# Patient Record
Sex: Female | Born: 1975 | Race: White | Hispanic: Yes | Marital: Single | Smoking: Never smoker
Health system: Southern US, Community
[De-identification: ages and names within clinical notes are randomized; demographics above are authoritative.]

## PROBLEM LIST (undated history)

## (undated) DIAGNOSIS — K08109 Complete loss of teeth, unspecified cause, unspecified class: Secondary | ICD-10-CM

## (undated) DIAGNOSIS — Z5189 Encounter for other specified aftercare: Secondary | ICD-10-CM

## (undated) DIAGNOSIS — N920 Excessive and frequent menstruation with regular cycle: Secondary | ICD-10-CM

## (undated) DIAGNOSIS — D259 Leiomyoma of uterus, unspecified: Secondary | ICD-10-CM

## (undated) DIAGNOSIS — Z972 Presence of dental prosthetic device (complete) (partial): Secondary | ICD-10-CM

## (undated) DIAGNOSIS — D509 Iron deficiency anemia, unspecified: Secondary | ICD-10-CM

## (undated) DIAGNOSIS — C801 Malignant (primary) neoplasm, unspecified: Secondary | ICD-10-CM

## (undated) DIAGNOSIS — D219 Benign neoplasm of connective and other soft tissue, unspecified: Secondary | ICD-10-CM

## (undated) HISTORY — PX: BREAST EXCISIONAL BIOPSY: SUR124

## (undated) HISTORY — DX: Encounter for other specified aftercare: Z51.89

## (undated) HISTORY — PX: BREAST BIOPSY: SHX20

## (undated) HISTORY — DX: Iron deficiency anemia, unspecified: D50.9

## (undated) HISTORY — PX: BREAST SURGERY: SHX581

---

## 2010-05-03 ENCOUNTER — Encounter: Admission: RE | Admit: 2010-05-03 | Discharge: 2010-05-03 | Payer: Self-pay | Admitting: Family Medicine

## 2016-10-16 HISTORY — PX: BREAST EXCISIONAL BIOPSY: SUR124

## 2017-01-22 ENCOUNTER — Other Ambulatory Visit (HOSPITAL_COMMUNITY): Payer: Self-pay | Admitting: *Deleted

## 2017-01-22 DIAGNOSIS — N6310 Unspecified lump in the right breast, unspecified quadrant: Secondary | ICD-10-CM

## 2017-01-22 DIAGNOSIS — L539 Erythematous condition, unspecified: Secondary | ICD-10-CM

## 2017-02-08 ENCOUNTER — Other Ambulatory Visit (HOSPITAL_COMMUNITY): Payer: Self-pay | Admitting: Obstetrics and Gynecology

## 2017-02-08 ENCOUNTER — Ambulatory Visit (HOSPITAL_COMMUNITY)
Admission: RE | Admit: 2017-02-08 | Discharge: 2017-02-08 | Disposition: A | Discharge status: Home / Self Care | Payer: Self-pay | Source: Ambulatory Visit | Attending: Obstetrics and Gynecology | Admitting: Obstetrics and Gynecology

## 2017-02-08 ENCOUNTER — Ambulatory Visit
Admission: RE | Admit: 2017-02-08 | Discharge: 2017-02-08 | Disposition: A | Discharge status: Home / Self Care | Payer: No Typology Code available for payment source | Source: Ambulatory Visit | Attending: Obstetrics and Gynecology | Admitting: Obstetrics and Gynecology

## 2017-02-08 ENCOUNTER — Encounter (HOSPITAL_COMMUNITY): Payer: Self-pay | Admitting: *Deleted

## 2017-02-08 VITALS — BP 132/76 | Temp 98.7°F | Ht 61.0 in | Wt 132.0 lb

## 2017-02-08 DIAGNOSIS — N6310 Unspecified lump in the right breast, unspecified quadrant: Secondary | ICD-10-CM

## 2017-02-08 DIAGNOSIS — N644 Mastodynia: Secondary | ICD-10-CM

## 2017-02-08 DIAGNOSIS — Z1239 Encounter for other screening for malignant neoplasm of breast: Secondary | ICD-10-CM

## 2017-02-08 DIAGNOSIS — N6315 Unspecified lump in the right breast, overlapping quadrants: Secondary | ICD-10-CM

## 2017-02-08 DIAGNOSIS — N6489 Other specified disorders of breast: Secondary | ICD-10-CM

## 2017-02-08 DIAGNOSIS — L539 Erythematous condition, unspecified: Secondary | ICD-10-CM

## 2017-02-08 NOTE — Progress Notes (Signed)
Complaints of right breast lump x 7-8 years and a rash on bilateral breasts that was given some cream from the [facility].  Pap Smear: Pap smear not completed today. Last Pap smear was in March 2018 at the [facility] and normal per patient. Per patient has no history of an abnormal Pap smear.  Physical exam: Breasts Breasts symmetrical. Darkened area left breast at 7 o'clock right under areola. Darkened area right breast under areola. Patient stated rash was located within the darkened areas but healed since started using cream given by the [facility]. No nipple retraction bilateral breasts. No nipple discharge bilateral breasts. No lymphadenopathy. No lumps palpated left breast. Palpated a lump within the right breast at 9 o'clock 4 cm from the nipple. Complaints of left up outer breast tenderness on exam. Referred patient to the [facility] for diagnostic mammogram and possible bilateral breast ultrasounds. Appointment scheduled for Thursday, April. 26, 2018 at 1520.        Pelvic/Bimanual No Pap smear completed today since last Pap smear was in March 2018 per patient. Pap smear not indicated per BCCCP guidelines.   Smoking History: Patient has never smoked.  Patient Navigation: Patient education provided. Access to services provided for patient through [facility] program. Spanish interpreter provided.  Used Spanish interpreter Marta Col from [facility].

## 2017-02-08 NOTE — Patient Instructions (Signed)
Explained breast self awareness with Evangelin Payes-Martinez. Patient did not need a Pap smear today due to last Pap smear was in March 2018 per patient. Let her know BCCCP will cover Pap smears every 3 years unless has a history of abnormal Pap smears. Referred patient to the [facility] for diagnostic mammogram and possible bilateral breast ultrasounds. Appointment scheduled for Thursday, April. 26, 2018 at 1520.  Evangelin Payes-Martinez verbalized understanding.  Bruna Dills, Arvil Chaco, RN 2:32 PM

## 2017-02-09 ENCOUNTER — Encounter (HOSPITAL_COMMUNITY): Payer: Self-pay | Admitting: *Deleted

## 2017-02-15 ENCOUNTER — Ambulatory Visit
Admission: RE | Admit: 2017-02-15 | Discharge: 2017-02-15 | Disposition: A | Discharge status: Home / Self Care | Payer: No Typology Code available for payment source | Source: Ambulatory Visit | Attending: Obstetrics and Gynecology | Admitting: Obstetrics and Gynecology

## 2017-02-15 ENCOUNTER — Other Ambulatory Visit (HOSPITAL_COMMUNITY): Payer: Self-pay | Admitting: Obstetrics and Gynecology

## 2017-02-15 DIAGNOSIS — N6310 Unspecified lump in the right breast, unspecified quadrant: Secondary | ICD-10-CM

## 2017-02-15 DIAGNOSIS — L539 Erythematous condition, unspecified: Secondary | ICD-10-CM

## 2017-02-15 DIAGNOSIS — N6489 Other specified disorders of breast: Secondary | ICD-10-CM

## 2017-02-20 ENCOUNTER — Other Ambulatory Visit: Payer: Self-pay

## 2017-02-20 ENCOUNTER — Other Ambulatory Visit: Payer: Self-pay | Admitting: Obstetrics and Gynecology

## 2017-02-20 DIAGNOSIS — N6489 Other specified disorders of breast: Secondary | ICD-10-CM

## 2017-02-23 ENCOUNTER — Ambulatory Visit
Admission: RE | Admit: 2017-02-23 | Discharge: 2017-02-23 | Disposition: A | Discharge status: Home / Self Care | Payer: No Typology Code available for payment source | Source: Ambulatory Visit | Attending: Obstetrics and Gynecology | Admitting: Obstetrics and Gynecology

## 2017-02-23 DIAGNOSIS — N6489 Other specified disorders of breast: Secondary | ICD-10-CM

## 2017-03-14 ENCOUNTER — Ambulatory Visit: Payer: Self-pay | Admitting: General Surgery

## 2017-03-14 DIAGNOSIS — N6022 Fibroadenosis of left breast: Secondary | ICD-10-CM

## 2017-03-19 ENCOUNTER — Other Ambulatory Visit: Payer: Self-pay | Admitting: General Surgery

## 2017-03-19 DIAGNOSIS — N6022 Fibroadenosis of left breast: Secondary | ICD-10-CM

## 2017-03-21 ENCOUNTER — Encounter (HOSPITAL_COMMUNITY): Payer: Self-pay

## 2017-03-21 ENCOUNTER — Encounter (HOSPITAL_COMMUNITY)
Admission: RE | Admit: 2017-03-21 | Discharge: 2017-03-21 | Disposition: A | Discharge status: Home / Self Care | Payer: No Typology Code available for payment source | Source: Ambulatory Visit | Attending: General Surgery | Admitting: General Surgery

## 2017-03-21 DIAGNOSIS — Z01812 Encounter for preprocedural laboratory examination: Secondary | ICD-10-CM | POA: Insufficient documentation

## 2017-03-21 HISTORY — DX: Malignant (primary) neoplasm, unspecified: C80.1

## 2017-03-21 LAB — BASIC METABOLIC PANEL
ANION GAP: 8 (ref 5–15)
BUN: 12 mg/dL (ref 6–20)
CALCIUM: 9.2 mg/dL (ref 8.9–10.3)
CO2: 25 mmol/L (ref 22–32)
Chloride: 105 mmol/L (ref 101–111)
Creatinine, Ser: 0.61 mg/dL (ref 0.44–1.00)
GFR calc Af Amer: >60 mL/min (ref >60)
Glucose, Bld: 105 mg/dL — ABNORMAL HIGH (ref 65–99)
Potassium: 4 mmol/L (ref 3.5–5.1)
SODIUM: 138 mmol/L (ref 135–145)

## 2017-03-21 LAB — HCG, SERUM, QUALITATIVE: PREG SERUM: NEGATIVE

## 2017-03-21 LAB — CBC
HCT: 39.3 % (ref 36.0–46.0)
Hemoglobin: 12.7 g/dL (ref 12.0–15.0)
MCH: 27.6 pg (ref 26.0–34.0)
MCHC: 32.3 g/dL (ref 30.0–36.0)
MCV: 85.4 fL (ref 78.0–100.0)
PLATELETS: 298 10*3/uL (ref 150–400)
RBC: 4.6 MIL/uL (ref 3.87–5.11)
RDW: 14.6 % (ref 11.5–15.5)
WBC: 6 10*3/uL (ref 4.0–10.5)

## 2017-03-21 NOTE — Pre-Procedure Instructions (Signed)
Carla Fletcher  03/21/2017      [place], [place] [address] Phone: [phone] Fax: [phone]    Your procedure is scheduled on Monday, March 26, 2017.  Report to [facility] Admitting at 11:30 A.M.  Call this number if you have problems the morning of surgery:  [phone]   Remember:  Do not eat food or drink liquids after midnight.  Take these medicines the morning of surgery with A SIP OF WATER: acetaminophen (Tylenol)   7 days prior to surgery STOP taking any Aspirin, Aleve, Naproxen, Ibuprofen, Motrin, Advil, Goody's, BC's, all herbal medications, fish oil, and all vitamins   Do not wear jewelry, make-up or nail polish.  Do not wear lotions, powders, or perfumes, or deoderant.  Do not shave 48 hours prior to surgery.  Men may shave face and neck.  Do not bring valuables to the hospital.  [facility] is not responsible for any belongings or valuables.  Contacts, dentures or bridgework may not be worn into surgery.  Leave your suitcase in the car.  After surgery it may be brought to your room.  For patients admitted to the hospital, discharge time will be determined by your treatment team.  Patients discharged the day of surgery will not be allowed to drive home.   Name and phone number of your driver:    Special instructions:     [place]- Preparing For Surgery  Before surgery, you can play an important role. Because skin is not sterile, your skin needs to be as free of germs as possible. You can reduce the number of germs on your skin by washing with CHG (chlorahexidine gluconate) Soap before surgery.  CHG is an antiseptic cleaner which kills germs and bonds with the skin to continue killing germs even after washing.  Please do not use if you have an allergy to CHG or antibacterial soaps. If your skin becomes reddened/irritated stop using the CHG.  Do not  shave (including legs and underarms) for at least 48 hours prior to first CHG shower. It is OK to shave your face.  Please follow these instructions carefully.   1. Shower the NIGHT BEFORE SURGERY and the MORNING OF SURGERY with CHG.   2. If you chose to wash your hair, wash your hair first as usual with your normal shampoo.  3. After you shampoo, rinse your hair and body thoroughly to remove the shampoo.  4. Use CHG as you would any other liquid soap. You can apply CHG directly to the skin and wash gently with a scrungie or a clean washcloth.   5. Apply the CHG Soap to your body ONLY FROM THE NECK DOWN.  Do not use on open wounds or open sores. Avoid contact with your eyes, ears, mouth and genitals (private parts). Wash genitals (private parts) with your normal soap.  6. Wash thoroughly, paying special attention to the area where your surgery will be performed.  7. Thoroughly rinse your body with warm water from the neck down.  8. DO NOT shower/wash with your normal soap after using and rinsing off the CHG Soap.  9. Pat yourself dry with a CLEAN TOWEL.   10. Wear CLEAN PAJAMAS   11. Place CLEAN SHEETS on your bed the night of your first shower and DO NOT SLEEP WITH PETS.    Day of Surgery: Do not apply any deodorants/lotions. Please wear  clean clothes to the hospital/surgery center.      Please read over the following fact sheets that you were given. Pain Booklet and Surgical Site Infection Prevention

## 2017-03-21 NOTE — Pre-Procedure Instructions (Signed)
Instrucciones Para Antes de la Ciruga   Su ciruga est programada para-(your procedure is scheduled on)  Monday March 26, 2017   [place] Admitting - (enter)    Por favor llame al [phone] si tiene algn problema en la maana de la ciruga. (please call if you have any problems the morning of surgery.)                  Recuerde: (Remember)   No coma alimentos ni tome lquidos, incluyendo agua, despus de la medianoche del  (Do not eat food or drink liquids including water after midnight on_______________   [employer] medicinas en la maana de la ciruga con un SORBITO de agua (take these meds the morning of surgery with a SIP of water) ________________________________   [facility] los dientes en la maana de la [place]. (you may brush your teeth the morning of surgery)   No use joyas, maquillaje de ojos, lpiz labial, crema para el cuerpo o esmalte de uas oscuro. (Do not wear jewelry, eye makeup, lipstick, body lotion, or dark fingernail polish)   No puede usar desodorante. (you may wear deodorant)   Si va a ser ingresado despues de la ciruga, deje la maleta en el carro hasta que se le haya asignado una habitacin. (If you are to be admitted after surgery, leave suitcase in car until your room has been assigned.)   A los pacientes que se les d de alta el mismo da no se les permitir manejar a casa.  (Patients discharged on the day of surgery will not be allowed to drive home)   Use ropa suelta y cmoda de regreso a casa. (wear loose comfortable clothes for ride home)    Firma del paciente (patient signature) ______________________________________   Carla Fletcher  03/21/2017      [place], [place] [address] Phone: [phone] Fax: [phone]    Your procedure is scheduled on Monday, March 26, 2017.  Report to [facility] Admitting at 11:30 A.M.  Call this number if you have problems the morning of surgery:  [phone]   Remember:  Do not eat food or drink liquids after midnight.  Take these medicines the morning of surgery with A SIP OF WATER: acetaminophen (Tylenol)   7 days prior to surgery STOP taking any Aspirin, Aleve, Naproxen, Ibuprofen, Motrin, Advil, Goody's, BC's, all herbal medications, fish oil, and all vitamins   Do not wear jewelry, make-up or nail polish.  Do not wear lotions, powders, or perfumes, or deoderant.  Do not shave 48 hours prior to surgery.  Men may shave face and neck.  Do not bring valuables to the hospital.  [facility] is not responsible for any belongings or valuables.  Contacts, dentures or bridgework may not be worn into surgery.  Leave your suitcase in the car.  After surgery it may be brought to your room.  For patients admitted to the hospital, discharge time will be determined by your treatment team.  Patients discharged the day of surgery will not be allowed to drive home.   Name and phone number of your driver:    Special instructions:     [facility]- Preparing For Surgery  Before surgery, you can play an important role. Because skin is not sterile, your skin needs to be as free of germs as possible. You can reduce the number of  germs on your skin by washing with CHG (chlorahexidine gluconate) Soap before surgery.  CHG is an antiseptic cleaner which kills germs and bonds with the skin to continue killing germs even after washing.  Please do not use if you have an allergy to CHG or antibacterial soaps. If your skin becomes reddened/irritated stop using the CHG.  Do not shave (including legs and underarms) for at least 48 hours prior to first CHG shower. It is OK to shave your face.  Please follow these instructions carefully.   1. Shower the NIGHT BEFORE SURGERY and the MORNING OF SURGERY with CHG.   2. If you  chose to wash your hair, wash your hair first as usual with your normal shampoo.  3. After you shampoo, rinse your hair and body thoroughly to remove the shampoo.  4. Use CHG as you would any other liquid soap. You can apply CHG directly to the skin and wash gently with a scrungie or a clean washcloth.   5. Apply the CHG Soap to your body ONLY FROM THE NECK DOWN.  Do not use on open wounds or open sores. Avoid contact with your eyes, ears, mouth and genitals (private parts). Wash genitals (private parts) with your normal soap.  6. Wash thoroughly, paying special attention to the area where your surgery will be performed.  7. Thoroughly rinse your body with warm water from the neck down.  8. DO NOT shower/wash with your normal soap after using and rinsing off the CHG Soap.  9. Pat yourself dry with a CLEAN TOWEL.   10. Wear CLEAN PAJAMAS   11. Place CLEAN SHEETS on your bed the night of your first shower and DO NOT SLEEP WITH PETS.    Day of Surgery: Do not apply any deodorants/lotions. Please wear clean clothes to the hospital/surgery center.      Please read over the following fact sheets that you were given. Pain Booklet and Surgical Site Infection Prevention

## 2017-03-22 ENCOUNTER — Ambulatory Visit
Admission: RE | Admit: 2017-03-22 | Discharge: 2017-03-22 | Disposition: A | Discharge status: Home / Self Care | Payer: No Typology Code available for payment source | Source: Ambulatory Visit | Attending: General Surgery | Admitting: General Surgery

## 2017-03-22 DIAGNOSIS — N6022 Fibroadenosis of left breast: Secondary | ICD-10-CM

## 2017-03-26 ENCOUNTER — Ambulatory Visit (HOSPITAL_COMMUNITY): Payer: Self-pay | Admitting: Anesthesiology

## 2017-03-26 ENCOUNTER — Encounter (HOSPITAL_COMMUNITY)
Admission: RE | Disposition: A | Discharge status: Home / Self Care | Payer: Self-pay | Source: Ambulatory Visit | Attending: General Surgery

## 2017-03-26 ENCOUNTER — Ambulatory Visit
Admission: RE | Admit: 2017-03-26 | Discharge: 2017-03-26 | Disposition: A | Discharge status: Home / Self Care | Payer: No Typology Code available for payment source | Source: Ambulatory Visit | Attending: General Surgery | Admitting: General Surgery

## 2017-03-26 ENCOUNTER — Ambulatory Visit (HOSPITAL_COMMUNITY)
Admission: RE | Admit: 2017-03-26 | Discharge: 2017-03-26 | Disposition: A | Discharge status: Home / Self Care | Payer: Self-pay | Source: Ambulatory Visit | Attending: General Surgery | Admitting: General Surgery

## 2017-03-26 DIAGNOSIS — N6092 Unspecified benign mammary dysplasia of left breast: Secondary | ICD-10-CM | POA: Insufficient documentation

## 2017-03-26 DIAGNOSIS — N6022 Fibroadenosis of left breast: Secondary | ICD-10-CM

## 2017-03-26 HISTORY — PX: BREAST LUMPECTOMY WITH RADIOACTIVE SEED LOCALIZATION: SHX6424

## 2017-03-26 SURGERY — BREAST LUMPECTOMY WITH RADIOACTIVE SEED LOCALIZATION
Anesthesia: General | Site: Breast | Laterality: Left

## 2017-03-26 MED ORDER — DEXAMETHASONE SODIUM PHOSPHATE 10 MG/ML IJ SOLN
INTRAMUSCULAR | Status: DC | PRN
  Administered 2017-03-26: 10 mg via INTRAVENOUS

## 2017-03-26 MED ORDER — PROMETHAZINE HCL 25 MG/ML IJ SOLN: 6.2500 mg | INTRAMUSCULAR | Status: DC | PRN

## 2017-03-26 MED ORDER — BUPIVACAINE-EPINEPHRINE (PF) 0.25% -1:200000 IJ SOLN
INTRAMUSCULAR | Status: AC
  Filled 2017-03-26: qty 30

## 2017-03-26 MED ORDER — EPHEDRINE SULFATE 50 MG/ML IJ SOLN
INTRAMUSCULAR | Status: DC | PRN
  Administered 2017-03-26: 5 mg via INTRAVENOUS

## 2017-03-26 MED ORDER — ACETAMINOPHEN 500 MG PO TABS
1000.0000 mg | ORAL_TABLET | ORAL | Status: AC
  Administered 2017-03-26: 1000 mg via ORAL
  Filled 2017-03-26: qty 2

## 2017-03-26 MED ORDER — LIDOCAINE HCL (CARDIAC) 20 MG/ML IV SOLN
INTRAVENOUS | Status: DC | PRN
  Administered 2017-03-26: 60 mg via INTRATRACHEAL

## 2017-03-26 MED ORDER — CHLORHEXIDINE GLUCONATE CLOTH 2 % EX PADS: 6.0000 | MEDICATED_PAD | Freq: Once | CUTANEOUS | Status: DC

## 2017-03-26 MED ORDER — PHENYLEPHRINE HCL 10 MG/ML IJ SOLN
INTRAMUSCULAR | Status: DC | PRN
  Administered 2017-03-26: 40 ug via INTRAVENOUS
  Administered 2017-03-26: 120 ug via INTRAVENOUS
  Administered 2017-03-26 (×4): 80 ug via INTRAVENOUS

## 2017-03-26 MED ORDER — OXYCODONE HCL 5 MG PO TABS
5.0000 mg | ORAL_TABLET | Freq: Once | ORAL | Status: AC | PRN
  Administered 2017-03-26: 5 mg via ORAL

## 2017-03-26 MED ORDER — OXYCODONE HCL 5 MG PO TABS
ORAL_TABLET | ORAL | Status: AC
  Filled 2017-03-26: qty 1

## 2017-03-26 MED ORDER — 0.9 % SODIUM CHLORIDE (POUR BTL) OPTIME
TOPICAL | Status: DC | PRN
  Administered 2017-03-26: 1000 mL

## 2017-03-26 MED ORDER — PROPOFOL 10 MG/ML IV BOLUS
INTRAVENOUS | Status: AC
  Filled 2017-03-26: qty 20

## 2017-03-26 MED ORDER — FENTANYL CITRATE (PF) 250 MCG/5ML IJ SOLN
INTRAMUSCULAR | Status: DC | PRN
  Administered 2017-03-26: 100 ug via INTRAVENOUS

## 2017-03-26 MED ORDER — CEFAZOLIN SODIUM-DEXTROSE 2-4 GM/100ML-% IV SOLN
2.0000 g | INTRAVENOUS | Status: AC
  Administered 2017-03-26: 2 g via INTRAVENOUS
  Filled 2017-03-26: qty 100

## 2017-03-26 MED ORDER — GABAPENTIN 300 MG PO CAPS
300.0000 mg | ORAL_CAPSULE | ORAL | Status: AC
  Administered 2017-03-26: 300 mg via ORAL
  Filled 2017-03-26: qty 1

## 2017-03-26 MED ORDER — CELECOXIB 200 MG PO CAPS
400.0000 mg | ORAL_CAPSULE | ORAL | Status: AC
  Administered 2017-03-26: 400 mg via ORAL
  Filled 2017-03-26: qty 2

## 2017-03-26 MED ORDER — KETOROLAC TROMETHAMINE 30 MG/ML IJ SOLN
INTRAMUSCULAR | Status: DC
Start: 2017-03-26 — End: 2017-03-26
  Filled 2017-03-26: qty 1

## 2017-03-26 MED ORDER — OXYCODONE HCL 5 MG/5ML PO SOLN: 5.0000 mg | Freq: Once | ORAL | Status: AC | PRN

## 2017-03-26 MED ORDER — LACTATED RINGERS IV SOLN
INTRAVENOUS | Status: DC
  Administered 2017-03-26 (×2): via INTRAVENOUS

## 2017-03-26 MED ORDER — KETOROLAC TROMETHAMINE 30 MG/ML IJ SOLN
30.0000 mg | Freq: Once | INTRAMUSCULAR | Status: DC | PRN
  Administered 2017-03-26: 30 mg via INTRAVENOUS

## 2017-03-26 MED ORDER — HYDROMORPHONE HCL 1 MG/ML IJ SOLN: 0.2500 mg | INTRAMUSCULAR | Status: DC | PRN

## 2017-03-26 MED ORDER — MIDAZOLAM HCL 2 MG/2ML IJ SOLN
INTRAMUSCULAR | Status: AC
  Filled 2017-03-26: qty 2

## 2017-03-26 MED ORDER — MIDAZOLAM HCL 5 MG/5ML IJ SOLN
INTRAMUSCULAR | Status: DC | PRN
  Administered 2017-03-26: 2 mg via INTRAVENOUS

## 2017-03-26 MED ORDER — PROPOFOL 10 MG/ML IV BOLUS
INTRAVENOUS | Status: DC | PRN
  Administered 2017-03-26: 170 mg via INTRAVENOUS

## 2017-03-26 MED ORDER — MEPERIDINE HCL 25 MG/ML IJ SOLN: 6.2500 mg | INTRAMUSCULAR | Status: DC | PRN

## 2017-03-26 MED ORDER — BUPIVACAINE-EPINEPHRINE 0.25% -1:200000 IJ SOLN
INTRAMUSCULAR | Status: DC | PRN
  Administered 2017-03-26: 20 mL

## 2017-03-26 MED ORDER — HYDROCODONE-ACETAMINOPHEN 5-325 MG PO TABS: 1.0000 | ORAL_TABLET | ORAL | 0 refills | Status: DC | PRN

## 2017-03-26 MED ORDER — ONDANSETRON HCL 4 MG/2ML IJ SOLN
INTRAMUSCULAR | Status: DC | PRN
  Administered 2017-03-26: 4 mg via INTRAVENOUS

## 2017-03-26 MED ORDER — FENTANYL CITRATE (PF) 250 MCG/5ML IJ SOLN
INTRAMUSCULAR | Status: AC
  Filled 2017-03-26: qty 5

## 2017-03-26 SURGICAL SUPPLY — 37 items
APPLIER CLIP 9.375 MED OPEN (MISCELLANEOUS) ×3
BLADE SURG 15 STRL LF DISP TIS (BLADE) ×1 IMPLANT
BLADE SURG 15 STRL SS (BLADE) ×2
CANISTER SUCT 3000ML PPV (MISCELLANEOUS) ×3 IMPLANT
CHLORAPREP W/TINT 26ML (MISCELLANEOUS) ×3 IMPLANT
CLIP APPLIE 9.375 MED OPEN (MISCELLANEOUS) ×1 IMPLANT
COVER PROBE W GEL 5X96 (DRAPES) ×3 IMPLANT
COVER SURGICAL LIGHT HANDLE (MISCELLANEOUS) ×3 IMPLANT
DERMABOND ADVANCED (GAUZE/BANDAGES/DRESSINGS) ×2
DERMABOND ADVANCED .7 DNX12 (GAUZE/BANDAGES/DRESSINGS) ×1 IMPLANT
DEVICE DUBIN SPECIMEN MAMMOGRA (MISCELLANEOUS) ×6 IMPLANT
DRAPE CHEST BREAST 15X10 FENES (DRAPES) ×3 IMPLANT
DRAPE UTILITY XL STRL (DRAPES) ×3 IMPLANT
ELECT COATED BLADE 2.86 ST (ELECTRODE) ×3 IMPLANT
ELECT REM PT RETURN 9FT ADLT (ELECTROSURGICAL) ×3
ELECTRODE REM PT RTRN 9FT ADLT (ELECTROSURGICAL) ×1 IMPLANT
GLOVE BIO SURGEON STRL SZ7.5 (GLOVE) ×6 IMPLANT
GOWN STRL REUS W/ TWL LRG LVL3 (GOWN DISPOSABLE) ×2 IMPLANT
GOWN STRL REUS W/TWL LRG LVL3 (GOWN DISPOSABLE) ×4
KIT BASIN OR (CUSTOM PROCEDURE TRAY) ×3 IMPLANT
KIT MARKER MARGIN INK (KITS) ×3 IMPLANT
LIGHT WAVEGUIDE WIDE FLAT (MISCELLANEOUS) ×3 IMPLANT
NEEDLE HYPO 25X1 1.5 SAFETY (NEEDLE) ×3 IMPLANT
NS IRRIG 1000ML POUR BTL (IV SOLUTION) ×3 IMPLANT
PACK SURGICAL SETUP 50X90 (CUSTOM PROCEDURE TRAY) ×3 IMPLANT
PENCIL BUTTON HOLSTER BLD 10FT (ELECTRODE) ×3 IMPLANT
SPONGE LAP 18X18 X RAY DECT (DISPOSABLE) ×3 IMPLANT
SUT MNCRL AB 4-0 PS2 18 (SUTURE) ×3 IMPLANT
SUT SILK 2 0 SH (SUTURE) IMPLANT
SUT VIC AB 3-0 SH 18 (SUTURE) ×3 IMPLANT
SYR BULB 3OZ (MISCELLANEOUS) ×3 IMPLANT
SYR CONTROL 10ML LL (SYRINGE) ×3 IMPLANT
TOWEL OR 17X24 6PK STRL BLUE (TOWEL DISPOSABLE) ×3 IMPLANT
TOWEL OR 17X26 10 PK STRL BLUE (TOWEL DISPOSABLE) ×3 IMPLANT
TUBE CONNECTING 12'X1/4 (SUCTIONS) ×1
TUBE CONNECTING 12X1/4 (SUCTIONS) ×2 IMPLANT
YANKAUER SUCT BULB TIP NO VENT (SUCTIONS) ×3 IMPLANT

## 2017-03-26 NOTE — Anesthesia Postprocedure Evaluation (Signed)
Anesthesia Post Note  Patient: Carla Fletcher  Procedure(s) Performed: Procedure(s) (LRB): LEFT BREAST LUMPECTOMY X2 WITH RADIOACTIVE SEED LOCALIZATION (Left)     Patient location during evaluation: PACU Anesthesia Type: General Level of consciousness: sedated and patient cooperative Pain management: pain level controlled Vital Signs Assessment: post-procedure vital signs reviewed and stable Respiratory status: spontaneous breathing Cardiovascular status: stable Anesthetic complications: no    Last Vitals:  Vitals:   03/26/17 1530 03/26/17 1537  BP: 101/63   Pulse: 94 87  Resp: 16   Temp: 36.2 C     Last Pain:  Vitals:   03/26/17 1537  TempSrc:   PainSc: 2                  Nolon Nations

## 2017-03-26 NOTE — H&P (Signed)
[facility]  Location: [facility] Surgery Patient #: [id number] DOB: 27-Nov-1975 Single / Language: Spanish / Race: Refused to Report/Unreported Female   History of Present Illness  The patient is a 41 year old female who presents with a breast mass. We are asked to see the patient in consultation by Dr. Curlene Dolphin to evaluate her for 2 complex sclerosing lesions in the left breast. The patient is a 41 year old Hispanic female who recently went for a routine screening mammogram. At that time she was found to have abnormalities in both breasts. 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 The left side was biopsied in 2 different places in both places were found to be complex sclerosing lesions. She denies any breast pain or discharge from the nipple. She denies any family history of breast cancer. Does not smoke.   Diagnostic Studies History  Colonoscopy  never Mammogram  within last year Pap Smear  1-5 years ago  Allergies No Known Drug Allergies   Medication History Tylenol (500MG  Capsule, Oral as needed) Active. Medications Reconciled  Social History  Caffeine use  Coffee. No alcohol use  No drug use  Tobacco use  Never smoker.  Family History  Arthritis  Mother. Hypertension  Mother.  Pregnancy / Birth History Age at menarche  77 years. Gravida  0 Para  0 Regular periods   Other Problems Back Pain  Gastroesophageal Reflux Disease  Hemorrhoids  Hypercholesterolemia  Lump In Breast     Review of Systems General Not Present- Appetite Loss, Chills, Fatigue, Fever, Night Sweats, Weight Gain and Weight Loss. Skin Present- Dryness. Not Present- Change in Wart/Mole, Hives, Jaundice, New Lesions, Non-Healing Wounds, Rash and Ulcer. HEENT Present- Seasonal Allergies and Sinus Pain. Not Present- Earache, Hearing Loss, Hoarseness, Nose Bleed, Oral Ulcers, Ringing in the Ears, Sore Throat, Visual  Disturbances, Wears glasses/contact lenses and Yellow Eyes. Respiratory Present- Snoring. Not Present- Bloody sputum, Chronic Cough, Difficulty Breathing and Wheezing. Breast Present- Breast Mass. Not Present- Breast Pain, Nipple Discharge and Skin Changes. Cardiovascular Present- Leg Cramps. Not Present- Chest Pain, Difficulty Breathing Lying Down, Palpitations, Rapid Heart Rate, Shortness of Breath and Swelling of Extremities. Gastrointestinal Present- Abdominal Pain, Bloating, Constipation, Excessive gas and Hemorrhoids. Not Present- Bloody Stool, Change in Bowel Habits, Chronic diarrhea, Difficulty Swallowing, Gets full quickly at meals, Indigestion, Nausea, Rectal Pain and Vomiting. Female Genitourinary Not Present- Frequency, Nocturia, Painful Urination, Pelvic Pain and Urgency. Musculoskeletal Present- Back Pain and Joint Pain. Not Present- Joint Stiffness, Muscle Pain, Muscle Weakness and Swelling of Extremities. Neurological Present- Headaches and Weakness. Not Present- Decreased Memory, Fainting, Numbness, Seizures, Tingling, Tremor and Trouble walking. Psychiatric Present- Change in Sleep Pattern and Frequent crying. Not Present- Anxiety, Bipolar, Depression and Fearful. Hematology Not Present- Blood Thinners, Easy Bruising, Excessive bleeding, Gland problems, HIV and Persistent Infections.  Vitals  Weight: 134.5 lb Height: 61in Body Surface Area: 1.6 m Body Mass Index: 25.41 kg/m  Temp.: 97.16F  Pulse: 83 (Regular)  BP: 120/76 (Sitting, Left Arm, Standard)       Physical Exam  General Mental Status-Alert. General Appearance-Consistent with stated age. Hydration-Well hydrated. Voice-Normal.  Head and Neck Head-normocephalic, atraumatic with no lesions or palpable masses. Trachea-midline. Thyroid Gland Characteristics - normal size and consistency.  Eye Eyeball - Bilateral-Extraocular movements intact. Sclera/Conjunctiva - Bilateral-No  scleral icterus.  Chest and Lung Exam Chest and lung exam reveals -quiet, even and easy respiratory effort with no use of accessory muscles and on auscultation, normal breath  sounds, no adventitious sounds and normal vocal resonance. Inspection Chest Wall - Normal. Back - normal.  Breast Note: There is no palpable mass in either breast. There is no palpable axillary, supraclavicular, or cervical lymphadenopathy.   Cardiovascular Cardiovascular examination reveals -normal heart sounds, regular rate and rhythm with no murmurs and normal pedal pulses bilaterally.  Abdomen Inspection Inspection of the abdomen reveals - No Hernias. Skin - Scar - no surgical scars. Palpation/Percussion Palpation and Percussion of the abdomen reveal - Soft, Non Tender, No Rebound tenderness, No Rigidity (guarding) and No hepatosplenomegaly. Auscultation Auscultation of the abdomen reveals - Bowel sounds normal.  Neurologic Neurologic evaluation reveals -alert and oriented x 3 with no impairment of recent or remote memory. Mental Status-Normal.  Musculoskeletal Normal Exam - Left-Upper Extremity Strength Normal and Lower Extremity Strength Normal. Normal Exam - Right-Upper Extremity Strength Normal and Lower Extremity Strength Normal.  Lymphatic Head & Neck  General Head & Neck Lymphatics: Bilateral - Description - Normal. Axillary  General Axillary Region: Bilateral - Description - Normal. Tenderness - Non Tender. Femoral & Inguinal  Generalized Femoral & Inguinal Lymphatics: Bilateral - Description - Normal. Tenderness - Non Tender.    Assessment & Plan  SCLEROSING ADENOSIS OF BREAST, LEFT (N60.22) Impression: The patient appears to have 2 areas of complex sclerosing lesion in the upper outer and lower inner left breast. Because of their appearance and because they can be considered high risk lesions I think it would be reasonable to remove these 2 areas. I discussed with her in  detail the risks and benefits of the operation to do this as well as some of the technical aspects and she understands and wishes to proceed. I will plan for 2 left breast radioactive seed localized lumpectomies. She also is having some left lower quadrant pain. I will treat her for her constipation with MiraLAX. If this does not improve then I will evaluate her with a CT scan of her abdomen and pelvis at the time of her follow-up Current Plans Pt Education - **Breast Diseases / (Spanish) - [employer]fermedades del seno: discussed with patient and provided information.

## 2017-03-26 NOTE — Anesthesia Preprocedure Evaluation (Signed)
Anesthesia Evaluation  Patient identified by MRN, date of birth, ID band Patient awake    Reviewed: Allergy & Precautions, NPO status , Patient's Chart, lab work & pertinent test results  Airway Mallampati: II  TM Distance: >3 FB Neck ROM: Full    Dental no notable dental hx.    Pulmonary neg pulmonary ROS,    Pulmonary exam normal breath sounds clear to auscultation       Cardiovascular negative cardio ROS Normal cardiovascular exam Rhythm:Regular Rate:Normal     Neuro/Psych negative neurological ROS  negative psych ROS   GI/Hepatic negative GI ROS, Neg liver ROS,   Endo/Other  negative endocrine ROS  Renal/GU negative Renal ROS     Musculoskeletal negative musculoskeletal ROS (+)   Abdominal   Peds  Hematology negative hematology ROS (+)   Anesthesia Other Findings   Reproductive/Obstetrics negative OB ROS                             Anesthesia Physical Anesthesia Plan  ASA: II  Anesthesia Plan: General   Post-op Pain Management:    Induction: Intravenous  PONV Risk Score and Plan: 3 and Ondansetron, Dexamethasone, Propofol and Midazolam  Airway Management Planned: LMA  Additional Equipment:   Intra-op Plan:   Post-operative Plan: Extubation in OR  Informed Consent: I have reviewed the patients History and Physical, chart, labs and discussed the procedure including the risks, benefits and alternatives for the proposed anesthesia with the patient or authorized representative who has indicated his/her understanding and acceptance.   Dental advisory given  Plan Discussed with: CRNA  Anesthesia Plan Comments:         Anesthesia Quick Evaluation

## 2017-03-26 NOTE — Transfer of Care (Signed)
Immediate Anesthesia Transfer of Care Note  Patient: Carla Fletcher  Procedure(s) Performed: Procedure(s): LEFT BREAST LUMPECTOMY X2 WITH RADIOACTIVE SEED LOCALIZATION (Left)  Patient Location: PACU  Anesthesia Type:General  Level of Consciousness: awake, alert  and oriented  Airway & Oxygen Therapy: Patient Spontanous Breathing and Patient connected to nasal cannula oxygen  Post-op Assessment: Report given to RN and Post -op Vital signs reviewed and stable  Post vital signs: Reviewed and stable  Last Vitals:  Vitals:   03/26/17 1125 03/26/17 1431  BP: 122/61 (!) (P) 100/58  Pulse: 88 (P) 88  Resp: 18 (P) 12  Temp: 37.1 C (P) 36.6 C    Last Pain:  Vitals:   03/26/17 1125  TempSrc: Oral         Complications: No apparent anesthesia complications

## 2017-03-26 NOTE — Op Note (Signed)
03/26/2017  2:26 PM  PATIENT:  Carla Fletcher  41 y.o. female  PRE-OPERATIVE DIAGNOSIS:  LEFT BREAST COMPLEX SCLEROISING LESION X2  POST-OPERATIVE DIAGNOSIS:  LEFT BREAST COMPLEX SCLEROISING LESION X2  PROCEDURE:  Procedure(s): LEFT BREAST LUMPECTOMY X2 WITH RADIOACTIVE SEED LOCALIZATION (Left)  SURGEON:  Surgeon(s) and Role:    * Jovita Kussmaul, MD - Primary  PHYSICIAN ASSISTANT:   ASSISTANTS: none   ANESTHESIA:   local and general  EBL:  Total I/O In: 1000 [I.V.:1000] Out: -   BLOOD ADMINISTERED:none  DRAINS: none   LOCAL MEDICATIONS USED:  MARCAINE     SPECIMEN:  Source of Specimen:  left breast tissue X 2  DISPOSITION OF SPECIMEN:  PATHOLOGY  COUNTS:  YES  TOURNIQUET:  * No tourniquets in log *  DICTATION: .Dragon Dictation   After informed consent was obtained patient was brought to the operating room and placed in the supine position on the operating table. After adequate induction of general anesthesia the patient's left breast was prepped with ChloraPrep, allowed to dry, and draped in usual sterile manner. An appropriate timeout was performed. Previously an I-125 seed was placed in 2 areas of the upper left breast to mark areas of a complex sclerosing lesion. The neoprobe was set to I-125 in both areas of radioactivity were readily identified in the upper portion of the left breast. The entire area was infiltrated with quarter percent Marcaine with epinephrine. A curvilinear incision was made with a 15 blade knife along the upper edge of the areola. The incision was carried through the skin and subcutaneous tissue sharply with electrocautery. Once into the breast tissue dissection was carried towards each radioactive seed separately using the electrocautery. As I approached each radioactive seed then removed a circular portion of breast tissue sharply with electrocautery around each radioactive seed. This was done while checking the area of radioactivity  frequently. Once each specimen was removed each specimen was oriented with the appropriate paint colors. A specimen radiograph was performed on each specimen that showed the clip and seed to be within the specimen. Both specimens were then sent to pathology for further evaluation. Hemostasis was achieved using the Bovie electrocautery. The wounds were irrigated with saline and infiltrated with more quarter percent Marcaine. The deep layer of the wound was then closed with layers of interrupted 3-0 Vicryl stitches. The skin was then closed with interrupted 4-0 Monocryl subcuticular stitches. Dermabond dressings were applied. The patient tolerated the procedure well. At the end of the case all needle sponge and instrument counts were correct. The patient was then awakened and taken to recovery in stable condition.  PLAN OF CARE: Discharge to home after PACU  PATIENT DISPOSITION:  PACU - hemodynamically stable.   Delay start of Pharmacological VTE agent (>24hrs) due to surgical blood loss or risk of bleeding: not applicable

## 2017-03-26 NOTE — Interval H&P Note (Signed)
History and Physical Interval Note:  03/26/2017 12:57 PM  Carla Fletcher  has presented today for surgery, with the diagnosis of LEFT BREAST COMPLEX SCLEROISING LESION X2  The various methods of treatment have been discussed with the patient and family. After consideration of risks, benefits and other options for treatment, the patient has consented to  Procedure(s): LEFT BREAST LUMPECTOMY X2 WITH RADIOACTIVE SEED LOCALIZATION (Left) as a surgical intervention .  The patient's history has been reviewed, patient examined, no change in status, stable for surgery.  I have reviewed the patient's chart and labs.  Questions were answered to the patient's satisfaction.     TOTH III,Richad Ramsay S

## 2017-03-26 NOTE — Anesthesia Procedure Notes (Signed)
Procedure Name: LMA Insertion Date/Time: 03/26/2017 1:32 PM Performed by: Mariea Clonts Pre-anesthesia Checklist: Patient identified, Emergency Drugs available, Suction available and Patient being monitored Patient Re-evaluated:Patient Re-evaluated prior to inductionOxygen Delivery Method: Circle System Utilized Preoxygenation: Pre-oxygenation with 100% oxygen Intubation Type: IV induction Ventilation: Mask ventilation without difficulty LMA: LMA inserted LMA Size: 4.0 Number of attempts: 1 Airway Equipment and Method: Bite block Placement Confirmation: positive ETCO2 Tube secured with: Tape Dental Injury: Teeth and Oropharynx as per pre-operative assessment

## 2017-03-27 ENCOUNTER — Encounter (HOSPITAL_COMMUNITY): Payer: Self-pay | Admitting: General Surgery

## 2017-06-15 ENCOUNTER — Encounter: Payer: Self-pay | Admitting: Gastroenterology

## 2017-06-25 ENCOUNTER — Emergency Department (HOSPITAL_COMMUNITY)
Admission: EM | Admit: 2017-06-25 | Discharge: 2017-06-25 | Disposition: A | Discharge status: Home / Self Care | Payer: No Typology Code available for payment source | Attending: Emergency Medicine | Admitting: Emergency Medicine

## 2017-06-25 ENCOUNTER — Encounter (HOSPITAL_COMMUNITY): Payer: Self-pay | Admitting: *Deleted

## 2017-06-25 DIAGNOSIS — Z853 Personal history of malignant neoplasm of breast: Secondary | ICD-10-CM | POA: Insufficient documentation

## 2017-06-25 DIAGNOSIS — R1032 Left lower quadrant pain: Secondary | ICD-10-CM

## 2017-06-25 LAB — COMPREHENSIVE METABOLIC PANEL
ALT: 21 U/L (ref 14–54)
AST: 29 U/L (ref 15–41)
Albumin: 4.3 g/dL (ref 3.5–5.0)
Alkaline Phosphatase: 67 U/L (ref 38–126)
Anion gap: 7 (ref 5–15)
BUN: 10 mg/dL (ref 6–20)
CO2: 25 mmol/L (ref 22–32)
Calcium: 9.1 mg/dL (ref 8.9–10.3)
Chloride: 106 mmol/L (ref 101–111)
Creatinine, Ser: 0.56 mg/dL (ref 0.44–1.00)
GFR calc Af Amer: >60 mL/min (ref >60)
GFR calc non Af Amer: >60 mL/min (ref >60)
Glucose, Bld: 99 mg/dL (ref 65–99)
Potassium: 3.9 mmol/L (ref 3.5–5.1)
Sodium: 138 mmol/L (ref 135–145)
Total Bilirubin: 0.5 mg/dL (ref 0.3–1.2)
Total Protein: 7.9 g/dL (ref 6.5–8.1)

## 2017-06-25 LAB — URINALYSIS, ROUTINE W REFLEX MICROSCOPIC
Bilirubin Urine: NEGATIVE
Glucose, UA: NEGATIVE mg/dL
Hgb urine dipstick: NEGATIVE
Ketones, ur: NEGATIVE mg/dL
Leukocytes, UA: NEGATIVE
Nitrite: NEGATIVE
Protein, ur: NEGATIVE mg/dL
Specific Gravity, Urine: 1.008 (ref 1.005–1.030)
pH: 6 (ref 5.0–8.0)

## 2017-06-25 LAB — LIPASE, BLOOD: Lipase: 30 U/L (ref 11–51)

## 2017-06-25 LAB — I-STAT BETA HCG BLOOD, ED (MC, WL, AP ONLY): I-stat hCG, quantitative: <5 m[IU]/mL (ref <5)

## 2017-06-25 LAB — CBC
HCT: 41.6 % (ref 36.0–46.0)
Hemoglobin: 13.4 g/dL (ref 12.0–15.0)
MCH: 27.5 pg (ref 26.0–34.0)
MCHC: 32.2 g/dL (ref 30.0–36.0)
MCV: 85.2 fL (ref 78.0–100.0)
Platelets: 308 10*3/uL (ref 150–400)
RBC: 4.88 MIL/uL (ref 3.87–5.11)
RDW: 14.1 % (ref 11.5–15.5)
WBC: 7.1 10*3/uL (ref 4.0–10.5)

## 2017-06-25 MED ORDER — TRAMADOL HCL 50 MG PO TABS: 50.0000 mg | ORAL_TABLET | Freq: Four times a day (QID) | ORAL | 0 refills | Status: DC | PRN

## 2017-06-25 MED ORDER — METRONIDAZOLE 500 MG PO TABS: 500.0000 mg | ORAL_TABLET | Freq: Three times a day (TID) | ORAL | 0 refills | Status: DC

## 2017-06-25 MED ORDER — CIPROFLOXACIN HCL 500 MG PO TABS: 500.0000 mg | ORAL_TABLET | Freq: Two times a day (BID) | ORAL | 0 refills | Status: DC

## 2017-06-25 NOTE — ED Triage Notes (Signed)
Pt c/o LLQ pain that increases during her menstrual cycle.  Also c/o constipation, but denies vaginal bleeding.

## 2017-06-29 ENCOUNTER — Encounter: Payer: Self-pay | Admitting: Gastroenterology

## 2017-06-29 ENCOUNTER — Ambulatory Visit (INDEPENDENT_AMBULATORY_CARE_PROVIDER_SITE_OTHER): Payer: Self-pay | Admitting: Gastroenterology

## 2017-06-29 ENCOUNTER — Other Ambulatory Visit: Payer: No Typology Code available for payment source

## 2017-06-29 ENCOUNTER — Other Ambulatory Visit: Payer: Self-pay

## 2017-06-29 VITALS — BP 128/88 | HR 100 | Ht 61.42 in | Wt 131.4 lb

## 2017-06-29 DIAGNOSIS — N912 Amenorrhea, unspecified: Secondary | ICD-10-CM

## 2017-06-29 DIAGNOSIS — R1032 Left lower quadrant pain: Secondary | ICD-10-CM

## 2017-06-29 DIAGNOSIS — R140 Abdominal distension (gaseous): Secondary | ICD-10-CM

## 2017-06-29 DIAGNOSIS — R110 Nausea: Secondary | ICD-10-CM

## 2017-06-29 LAB — HCG, SERUM, QUALITATIVE

## 2017-06-29 MED ORDER — CIPROFLOXACIN HCL 500 MG PO TABS: 500.0000 mg | ORAL_TABLET | Freq: Two times a day (BID) | ORAL | 0 refills | Status: DC

## 2017-06-29 MED ORDER — METRONIDAZOLE 500 MG PO TABS: 500.0000 mg | ORAL_TABLET | Freq: Three times a day (TID) | ORAL | 0 refills | Status: DC

## 2017-06-29 NOTE — Patient Instructions (Addendum)
If you are age 41 or older, your body mass index should be between 23-30. Your Body mass index is 24.49 kg/m. If this is out of the aforementioned range listed, please consider follow up with your Primary Care Provider.  If you are age 19 or younger, your body mass index should be between 19-25. Your Body mass index is 24.49 kg/m. If this is out of the aformentioned range listed, please consider follow up with your Primary Care Provider.   We have sent the following medications to your pharmacy for you to pick up at your convenience:  Flagyl  Cipro  Your physician has requested that you go to the basement for the following lab work before leaving today:  H. Pylori Stool Antigen  Pregnancy Test  You have been scheduled for a CT scan of the abdomen and pelvis at [facility] ([address]---this is in the same building as Press [facility]).   You are scheduled on Monday, September 17th at [facility] should arrive 15 minutes prior to your appointment time for registration. Please follow the written instructions below on the day of your exam:  WARNING: IF YOU ARE ALLERGIC TO IODINE/X-RAY DYE, PLEASE NOTIFY RADIOLOGY IMMEDIATELY AT [phone]! YOU WILL BE GIVEN A 13 HOUR PREMEDICATION PREP.  1) Do not eat anything after 5:00am (4 hours prior to your test) Liquids are fine. 2) You have been given 2 bottles of oral contrast to drink. The solution may taste better if refrigerated, but do NOT add ice or any other liquid to this solution. Shake well before drinking.    Drink 1 bottle of contrast @ 7:00am (2 hours prior to your exam)  Drink 1 bottle of contrast @ 8:00am (1 hour prior to your exam)  You may take any medications as prescribed with a small amount of water except for the following: Metformin, Glucophage, Glucovance, Avandamet, Riomet, Fortamet, Actoplus Met, Janumet, Glumetza or Metaglip. The above medications must be held the day of the exam AND 48 hours after the  exam.  The purpose of you drinking the oral contrast is to aid in the visualization of your intestinal tract. The contrast solution may cause some diarrhea. Before your exam is started, you will be given a small amount of fluid to drink. Depending on your individual set of symptoms, you may also receive an intravenous injection of x-ray contrast/dye. Plan on being at [facility] for 30 minutes or longer, depending on the type of exam you are having performed.  This test typically takes 30-45 minutes to complete.  If you have any questions regarding your exam or if you need to reschedule, you may call the CT department at [phone] between the hours of 8:00 am and 5:00 pm, Monday-Friday. Call [phone] for delays/cancellations due to weather.

## 2017-06-29 NOTE — Progress Notes (Signed)
06/29/2017 Carla Fletcher [id number] Feb 27, 1976   HISTORY OF PRESENT ILLNESS:  This is a 41 year old Hispanic female who is new to our office.  It was recommended that she follow up here after a recent emergency room visit. She is here today with an interpreter. She tells me that for the past 3 weeks she has been having discomfort in her lower abdomen, mostly her left lower quadrant. She says that she feels bloated all the time and feels very full with a lot of gas. Says that she feels better if she can belch or pass gas from her rectum. Complains of nausea as well.  She went to the emergency department on September 10. No imaging was performed. Labs revealed a normal CBC, CMP, lipase. She was empirically treated for diverticulitis with Cipro 500 mg twice a day and Flagyl 500 mg 3 times a day. She has now only been on the medication for 3 days and thinks that she has already noticed some slight improvement although she admits to being quite uncomfortable last evening. She feels that her bowel movements have been rather erratic recently as well. She denies seeing blood in her stool. She has never been told that she has diverticulosis in the past.  She also mentions to me that she was diagnosed with, and treated for, H. pylori a few years ago. Testing was never performed to confirm eradication.   Past Medical History:  Diagnosis Date  . Cancer [facility])    breast cancer, left   Past Surgical History:  Procedure Laterality Date  . BREAST LUMPECTOMY WITH RADIOACTIVE SEED LOCALIZATION Left 03/26/2017   Procedure: LEFT BREAST LUMPECTOMY X2 WITH RADIOACTIVE SEED LOCALIZATION;  Surgeon: Jovita Kussmaul, MD;  Location: [facility];  Service: General;  Laterality: Left;    reports that she has never smoked. She has never used smokeless tobacco. She reports that she does not drink alcohol or use drugs. family history includes Healthy in her father; Hypertension in her maternal grandmother and mother;  Hypotension in her paternal grandfather; Recurrent abdominal pain in her mother. Allergies  Allergen Reactions  . No Known Allergies       Outpatient Encounter Prescriptions as of 06/29/2017  Medication Sig  . ciprofloxacin (CIPRO) 500 MG tablet Take 1 tablet (500 mg total) by mouth every 12 (twelve) hours.  . metroNIDAZOLE (FLAGYL) 500 MG tablet Take 1 tablet (500 mg total) by mouth 3 (three) times daily.  . traMADol (ULTRAM) 50 MG tablet Take 1 tablet (50 mg total) by mouth every 6 (six) hours as needed.  . [DISCONTINUED] acetaminophen (TYLENOL) 500 MG tablet Take 500 mg by mouth every 6 (six) hours as needed for mild pain.  . [DISCONTINUED] HYDROcodone-acetaminophen (NORCO/VICODIN) 5-325 MG tablet Take 1-2 tablets by mouth every 4 (four) hours as needed for moderate pain or severe pain.  . [DISCONTINUED] polyethylene glycol (MIRALAX / GLYCOLAX) packet Take 17 g by mouth daily.   No facility-administered encounter medications on file as of 06/29/2017.      REVIEW OF SYSTEMS  : All other systems reviewed and negative except where noted in the History of Present Illness.   PHYSICAL EXAM: BP 128/88   Pulse 100   Ht 5' 1.42" (1.56 m)   Wt 131 lb 6 oz (59.6 kg)   LMP 05/30/2017   BMI 24.49 kg/m  General: Well developed Hispanic female in no acute distress Head: Normocephalic and atraumatic Eyes:  Sclerae anicteric, conjunctiva pink. Ears: Normal auditory acuity Lungs: Clear throughout  to auscultation; no increased WOB. Heart: Regular rate and rhythm; no M/R/G. Abdomen: Soft, non-distended.  BS present.  LLQ TTP. Musculoskeletal: Symmetrical with no gross deformities  Skin: No lesions on visible extremities Extremities: No edema  Neurological: Alert oriented x 4, grossly non-focal Psychological:  Alert and cooperative. Normal mood and affect  ASSESSMENT AND PLAN: *LLQ abdominal pain with nausea, and bloating/gas:  Began 3 weeks ago.  On antibiotics empirically for  diverticulitis after a visit to the emergency department. No imaging was performed. Has only been on antibiotics for 3 days but thinks that she may be noticing some improvement. We do not have any documentation that she even has diverticulosis. I am going to order CT scan of the abdomen and pelvis with contrast. We will have her complete a 14 day course of cipro and flagyl. *History of Hpylori treated elsewhere.  Never confirmed eradication.  Will check stool for Hpylori Ag.   CC:  Carla Fletcher, Vickii Chafe, MD

## 2017-07-01 ENCOUNTER — Encounter (HOSPITAL_COMMUNITY): Payer: Self-pay | Admitting: Emergency Medicine

## 2017-07-01 DIAGNOSIS — R1031 Right lower quadrant pain: Secondary | ICD-10-CM | POA: Insufficient documentation

## 2017-07-01 DIAGNOSIS — E871 Hypo-osmolality and hyponatremia: Secondary | ICD-10-CM | POA: Insufficient documentation

## 2017-07-01 DIAGNOSIS — Z853 Personal history of malignant neoplasm of breast: Secondary | ICD-10-CM | POA: Insufficient documentation

## 2017-07-01 DIAGNOSIS — R1013 Epigastric pain: Secondary | ICD-10-CM | POA: Insufficient documentation

## 2017-07-01 DIAGNOSIS — E876 Hypokalemia: Secondary | ICD-10-CM | POA: Insufficient documentation

## 2017-07-01 DIAGNOSIS — R112 Nausea with vomiting, unspecified: Secondary | ICD-10-CM | POA: Insufficient documentation

## 2017-07-01 LAB — URINALYSIS, ROUTINE W REFLEX MICROSCOPIC
Bilirubin Urine: NEGATIVE
GLUCOSE, UA: NEGATIVE mg/dL
HGB URINE DIPSTICK: NEGATIVE
KETONES UR: 5 mg/dL — AB
NITRITE: NEGATIVE
PH: 5 (ref 5.0–8.0)
PROTEIN: NEGATIVE mg/dL
Specific Gravity, Urine: 1.01 (ref 1.005–1.030)

## 2017-07-01 LAB — COMPREHENSIVE METABOLIC PANEL
ALT: 17 U/L (ref 14–54)
ANION GAP: 10 (ref 5–15)
AST: 29 U/L (ref 15–41)
Albumin: 4.1 g/dL (ref 3.5–5.0)
Alkaline Phosphatase: 67 U/L (ref 38–126)
BUN: 10 mg/dL (ref 6–20)
CALCIUM: 8.7 mg/dL — AB (ref 8.9–10.3)
CO2: 19 mmol/L — ABNORMAL LOW (ref 22–32)
CREATININE: 0.57 mg/dL (ref 0.44–1.00)
Chloride: 101 mmol/L (ref 101–111)
Glucose, Bld: 133 mg/dL — ABNORMAL HIGH (ref 65–99)
Potassium: 2.9 mmol/L — ABNORMAL LOW (ref 3.5–5.1)
Sodium: 130 mmol/L — ABNORMAL LOW (ref 135–145)
TOTAL PROTEIN: 6.9 g/dL (ref 6.5–8.1)
Total Bilirubin: 0.6 mg/dL (ref 0.3–1.2)

## 2017-07-01 LAB — CBC
HCT: 37.1 % (ref 36.0–46.0)
HEMOGLOBIN: 12.2 g/dL (ref 12.0–15.0)
MCH: 27.1 pg (ref 26.0–34.0)
MCHC: 32.9 g/dL (ref 30.0–36.0)
MCV: 82.4 fL (ref 78.0–100.0)
PLATELETS: 301 10*3/uL (ref 150–400)
RBC: 4.5 MIL/uL (ref 3.87–5.11)
RDW: 14.4 % (ref 11.5–15.5)
WBC: 11.9 10*3/uL — AB (ref 4.0–10.5)

## 2017-07-01 LAB — POC URINE PREG, ED: Preg Test, Ur: NEGATIVE

## 2017-07-01 LAB — LIPASE, BLOOD: LIPASE: 26 U/L (ref 11–51)

## 2017-07-01 NOTE — ED Triage Notes (Signed)
C/o LLQ pain that radiates across abd x 1 week with nausea.  Seen in ED 1 week ago for same and taking Tramadol without relief.  Scheduled to see GI.

## 2017-07-01 NOTE — Progress Notes (Signed)
I agree with the above note, plan 

## 2017-07-02 ENCOUNTER — Emergency Department (HOSPITAL_COMMUNITY): Payer: Self-pay

## 2017-07-02 ENCOUNTER — Encounter (HOSPITAL_COMMUNITY): Payer: Self-pay | Admitting: *Deleted

## 2017-07-02 ENCOUNTER — Emergency Department (HOSPITAL_COMMUNITY)
Admission: EM | Admit: 2017-07-02 | Discharge: 2017-07-02 | Disposition: A | Discharge status: Home / Self Care | Payer: Self-pay | Attending: Emergency Medicine | Admitting: Emergency Medicine

## 2017-07-02 ENCOUNTER — Inpatient Hospital Stay: Admission: RE | Admit: 2017-07-02 | Payer: No Typology Code available for payment source | Source: Ambulatory Visit

## 2017-07-02 DIAGNOSIS — R1013 Epigastric pain: Secondary | ICD-10-CM

## 2017-07-02 DIAGNOSIS — E876 Hypokalemia: Secondary | ICD-10-CM

## 2017-07-02 DIAGNOSIS — E871 Hypo-osmolality and hyponatremia: Secondary | ICD-10-CM

## 2017-07-02 MED ORDER — OMEPRAZOLE 20 MG PO CPDR: 20.0000 mg | DELAYED_RELEASE_CAPSULE | Freq: Every day | ORAL | 0 refills | Status: DC

## 2017-07-02 MED ORDER — GI COCKTAIL ~~LOC~~
30.0000 mL | Freq: Once | ORAL | Status: AC
  Administered 2017-07-02: 30 mL via ORAL
  Filled 2017-07-02: qty 30

## 2017-07-02 MED ORDER — ONDANSETRON HCL 4 MG/2ML IJ SOLN
4.0000 mg | Freq: Once | INTRAMUSCULAR | Status: AC
  Administered 2017-07-02: 4 mg via INTRAVENOUS
  Filled 2017-07-02: qty 2

## 2017-07-02 MED ORDER — SUCRALFATE 1 G PO TABS: 1.0000 g | ORAL_TABLET | Freq: Three times a day (TID) | ORAL | 0 refills | Status: DC

## 2017-07-02 MED ORDER — POTASSIUM CHLORIDE 10 MEQ/100ML IV SOLN
10.0000 meq | Freq: Once | INTRAVENOUS | Status: AC
  Administered 2017-07-02: 10 meq via INTRAVENOUS
  Filled 2017-07-02: qty 100

## 2017-07-02 MED ORDER — SODIUM CHLORIDE 0.9 % IV BOLUS (SEPSIS)
1000.0000 mL | Freq: Once | INTRAVENOUS | Status: AC
Start: 2017-07-02 — End: 2017-07-02
  Administered 2017-07-02: 1000 mL via INTRAVENOUS

## 2017-07-02 MED ORDER — IOPAMIDOL (ISOVUE-300) INJECTION 61%
INTRAVENOUS | Status: AC
  Administered 2017-07-02: 100 mL
  Filled 2017-07-02: qty 100

## 2017-07-02 MED ORDER — MORPHINE SULFATE (PF) 4 MG/ML IV SOLN
4.0000 mg | Freq: Once | INTRAVENOUS | Status: AC
  Administered 2017-07-02: 4 mg via INTRAVENOUS
  Filled 2017-07-02: qty 1

## 2017-07-02 NOTE — ED Provider Notes (Signed)
[facility] DEPT Provider Note   CSN: [id number] Arrival date & time: 07/01/17  2034     History   Chief Complaint Chief Complaint  Patient presents with  . Abdominal Pain    HPI Carla Fletcher is a 41 y.o. female.  Patient presents to the emergency department with chief complaint of epigastric abdominal pain. She states that the symptoms started about a week ago. She was seen for the same, and was discharged with Cipro and Flagyl. She states that she followed up with a gastroenterologist, who ordered a CT scan for later today. Patient states that the pain became such, that she cannot tolerate waiting. She rates pain is moderate to severe. She reports some associated nausea and vomiting. She denies any diarrhea. She reports that she feels bloated, and like she has to belch frequently. She reports some burning sensation in her epigastrium.she denies any diarrhea, dysuria, or vaginal discharge.   The history is provided by the patient. The history is limited by a language barrier. A language interpreter was used.    Past Medical History:  Diagnosis Date  . Cancer [facility])    breast cancer, left    Patient Active Problem List   Diagnosis Date Noted  . Left lower quadrant pain 06/29/2017  . Bloating 06/29/2017  . Nausea without vomiting 06/29/2017    Past Surgical History:  Procedure Laterality Date  . BREAST LUMPECTOMY WITH RADIOACTIVE SEED LOCALIZATION Left 03/26/2017   Procedure: LEFT BREAST LUMPECTOMY X2 WITH RADIOACTIVE SEED LOCALIZATION;  Surgeon: Autumn Messing III, MD;  Location: [facility];  Service: General;  Laterality: Left;    OB History    Gravida Para Term Preterm AB Living   0 0 0 0 0 0   SAB TAB Ectopic Multiple Live Births   0 0 0 0 0       Home Medications    Prior to Admission medications   Medication Sig Start Date End Date Taking? Authorizing Provider  ciprofloxacin (CIPRO) 500 MG tablet Take 1 tablet (500 mg total) by mouth 2 (two) times  daily. 06/29/17   Zehr, Laban Emperor, PA-C  metroNIDAZOLE (FLAGYL) 500 MG tablet Take 1 tablet (500 mg total) by mouth 3 (three) times daily. 06/29/17   Zehr, Laban Emperor, PA-C  traMADol (ULTRAM) 50 MG tablet Take 1 tablet (50 mg total) by mouth every 6 (six) hours as needed. 06/25/17   Virgel Manifold, MD    Family History Family History  Problem Relation Age of Onset  . Hypertension Mother   . Recurrent abdominal pain Mother   . Healthy Father   . Hypertension Maternal Grandmother        died age 2  . Hypotension Paternal Grandfather   . Colon cancer Neg Hx   . Rectal cancer Neg Hx   . Esophageal cancer Neg Hx   . Liver cancer Neg Hx   . Stomach cancer Neg Hx     Social History Social History  Substance Use Topics  . Smoking status: Never Smoker  . Smokeless tobacco: Never Used  . Alcohol use No     Allergies   No known allergies   Review of Systems Review of Systems  All other systems reviewed and are negative.    Physical Exam Updated Vital Signs BP 130/80 (BP Location: Right Arm)   Pulse 95   Temp 98.7 F (37.1 C) (Oral)   Resp 18   LMP 06/06/2017   SpO2 100%   Physical Exam  Constitutional: She is  oriented to person, place, and time. She appears well-developed and well-nourished.  HENT:  Head: Normocephalic and atraumatic.  Eyes: Pupils are equal, round, and reactive to light. Conjunctivae and EOM are normal.  Neck: Normal range of motion. Neck supple.  Cardiovascular: Normal rate and regular rhythm.  Exam reveals no gallop and no friction rub.   No murmur heard. Pulmonary/Chest: Effort normal and breath sounds normal. No respiratory distress. She has no wheezes. She has no rales. She exhibits no tenderness.  Abdominal: Soft. Bowel sounds are normal. She exhibits no distension and no mass. There is no tenderness. There is no rebound and no guarding.  No focal abdominal tenderness, no RLQ tenderness or pain at McBurney's point, no RUQ tenderness or Murphy's  sign, no left-sided abdominal tenderness, no fluid wave, or signs of peritonitis   Musculoskeletal: Normal range of motion. She exhibits no edema or tenderness.  Neurological: She is alert and oriented to person, place, and time.  Skin: Skin is warm and dry.  Psychiatric: She has a normal mood and affect. Her behavior is normal. Judgment and thought content normal.  Nursing note and vitals reviewed.    ED Treatments / Results  Labs (all labs ordered are listed, but only abnormal results are displayed) Labs Reviewed  COMPREHENSIVE METABOLIC PANEL - Abnormal; Notable for the following:       Result Value   Sodium 130 (*)    Potassium 2.9 (*)    CO2 19 (*)    Glucose, Bld 133 (*)    Calcium 8.7 (*)    All other components within normal limits  CBC - Abnormal; Notable for the following:    WBC 11.9 (*)    All other components within normal limits  URINALYSIS, ROUTINE W REFLEX MICROSCOPIC - Abnormal; Notable for the following:    APPearance HAZY (*)    Ketones, ur 5 (*)    Leukocytes, UA SMALL (*)    Bacteria, UA RARE (*)    Squamous Epithelial / LPF 0-5 (*)    All other components within normal limits  LIPASE, BLOOD  POC URINE PREG, ED    EKG  EKG Interpretation None       Radiology Ct Abdomen Pelvis W Contrast  Result Date: 07/02/2017 CLINICAL DATA:  LEFT lower quadrant pain for 1 week, nausea. History of breast cancer. EXAM: CT ABDOMEN AND PELVIS WITH CONTRAST TECHNIQUE: Multidetector CT imaging of the abdomen and pelvis was performed using the standard protocol following bolus administration of intravenous contrast. CONTRAST:  159mL ISOVUE-300 IOPAMIDOL (ISOVUE-300) INJECTION 61% COMPARISON:  None. FINDINGS: LOWER CHEST: Lung bases are clear. Included heart size is normal. No pericardial effusion. HEPATOBILIARY: Liver and gallbladder are normal. PANCREAS: Normal. SPLEEN: Normal. ADRENALS/URINARY TRACT: Kidneys are orthotopic, demonstrating symmetric enhancement. No  nephrolithiasis, hydronephrosis or solid renal masses. Bilateral pelviectasis in mildly dilated ureters. Too small to characterize hypodensity bilateral kidneys. Delayed imaging through the kidneys demonstrates symmetric prompt contrast excretion within the proximal urinary collecting system. Urinary bladder is distended without intravesicular calculi. Normal adrenal glands. STOMACH/BOWEL: The stomach, small and large bowel are normal in course and caliber without inflammatory changes, evaluation limited by lack of oral contrast. Moderate amount of retained large bowel stool. Normal appendix. VASCULAR/LYMPHATIC: Aortoiliac vessels are normal in course and caliber. No lymphadenopathy by CT size criteria. REPRODUCTIVE: Norm new lobulated posterior uterine wall contour or most compatible with leiomyoma. Al. 11 mm LEFT Bartholin cyst. OTHER: No intraperitoneal free fluid or free air. MUSCULOSKELETAL: Nonacute.  Mild sacroiliac  osteoarthrosis. IMPRESSION: 1. Distended urinary bladder, with mild pelviectasis, possible reflux. 2. Moderate amount of retained large bowel stool without bowel obstruction. Electronically Signed   By: Elon Alas M.D.   On: 07/02/2017 05:29    Procedures Procedures (including critical care time)  Medications Ordered in ED Medications  morphine 4 MG/ML injection 4 mg (not administered)  ondansetron (ZOFRAN) injection 4 mg (not administered)  potassium chloride 10 mEq in 100 mL IVPB (not administered)  gi cocktail (Maalox,Lidocaine,Donnatal) (not administered)  sodium chloride 0.9 % bolus 1,000 mL (not administered)     Initial Impression / Assessment and Plan / ED Course  I have reviewed the triage vital signs and the nursing notes.  Pertinent labs & imaging results that were available during my care of the patient were reviewed by me and considered in my medical decision making (see chart for details).    Patient with epigastric abdominal pain. CT scan ordered by  gastroenterology for later today. Patient could not tolerate the pain, and came to the emergency department. Will get this study here now. We'll treat patient's symptoms. Will reassess.  Laboratory workup is notable for mild hyponatremia and hypokalemia.  CT is negative for acute surgical findings. Patient feels significantly improved after GI cocktail.  Will treat with omeprazole and carafate.  She will need to follow-up with her doctor and GI.  Discussed her hyponatremia and hypokalemia.  She will follow-up with her doctor about this too.  She is stable and ready for discharge.    Final Clinical Impressions(s) / ED Diagnoses   Final diagnoses:  Epigastric pain  Hypokalemia  Hyponatremia    New Prescriptions New Prescriptions   OMEPRAZOLE (PRILOSEC) 20 MG CAPSULE    Take 1 capsule (20 mg total) by mouth daily.   SUCRALFATE (CARAFATE) 1 G TABLET    Take 1 tablet (1 g total) by mouth 4 (four) times daily -  with meals and at bedtime.     Montine Circle, PA-C 93/[id number]/02 [id number]    Delora Fuel, MD 35/32/99 [phone]

## 2017-07-02 NOTE — ED Notes (Signed)
Per PA fluids to finish infusing before d/c

## 2017-07-02 NOTE — ED Notes (Signed)
Pt to CT via stretcher

## 2017-07-03 ENCOUNTER — Other Ambulatory Visit: Payer: No Typology Code available for payment source

## 2017-07-03 DIAGNOSIS — R110 Nausea: Secondary | ICD-10-CM

## 2017-07-03 DIAGNOSIS — R1032 Left lower quadrant pain: Secondary | ICD-10-CM

## 2017-07-03 DIAGNOSIS — R140 Abdominal distension (gaseous): Secondary | ICD-10-CM

## 2017-07-04 ENCOUNTER — Telehealth: Payer: Self-pay | Admitting: Gastroenterology

## 2017-07-04 DIAGNOSIS — K5900 Constipation, unspecified: Secondary | ICD-10-CM

## 2017-07-04 LAB — HELICOBACTER PYLORI  SPECIAL ANTIGEN
MICRO NUMBER: 81031637
SPECIMEN QUALITY: ADEQUATE

## 2017-07-04 NOTE — Telephone Encounter (Signed)
Please let the patient know that her Hpylori study was negative.  CT scan from the ED just showed a moderate amount of stool in the left side of her colon c/w constipation and that could be causing her left sided pain.  Please have her get 2 bottles of magnesium citrate to clean her out.  Have her drink one and if no results in 2 hours then can drink the second one.  Then following that I want her to begin taking Miralax daily.  Her sodium and potassium levels were off slightly as well when she was in the ED so I want her to come and have those rechecked on Friday.

## 2017-07-04 NOTE — Telephone Encounter (Signed)
9/19 need interpreter

## 2017-07-04 NOTE — Telephone Encounter (Signed)
Zehr, Laban Emperor, PA-C sent to Jeoffrey Massed, RN        The symptoms that she was complaining about in the ED this time were different than what she reported at her previous ED visit and to me in the office last week. She was complaining of LLQ/left sided pain, which is why the ED empirically treated her for diverticulitis. LLQ pain can be explained by the constipation. On this ED visit it appears that she was complaining of epigastric pain, which was relieved by GI cocktail and she was prescribed omeprazole and carafate, which she can continue for now as well.

## 2017-07-04 NOTE — Telephone Encounter (Signed)
Carla Fletcher have you seen the Ct scan results this pt states you were suppose to be made aware.  Please advise

## 2017-07-05 NOTE — Telephone Encounter (Signed)
Carla Fletcher,  Please tell the pt her H pylori was negative, her CT showed constipation she needs 2 bottles of magnesium citrate.  Drink 1 bottle and then 2 hours later drink the other bottle.  After that use 1 capful of miralax in 8 oz water or juice daily.  Continue the omeprazole and carafate as prescribed. Have her come in next Friday for repeat labs.  Thanks

## 2017-07-05 NOTE — Telephone Encounter (Signed)
Left a voicemail in [place] for her to call back & ask for me.

## 2017-07-11 NOTE — Telephone Encounter (Signed)
Spoke to Ms [employer]. She will come in on Friday for her labs. All questions were answer. Understands to call back if she has any other questions.

## 2017-07-13 ENCOUNTER — Other Ambulatory Visit (INDEPENDENT_AMBULATORY_CARE_PROVIDER_SITE_OTHER): Payer: Self-pay

## 2017-07-13 DIAGNOSIS — K5900 Constipation, unspecified: Secondary | ICD-10-CM

## 2017-07-13 LAB — COMPREHENSIVE METABOLIC PANEL
ALBUMIN: 4.2 g/dL (ref 3.5–5.2)
ALT: 14 U/L (ref 0–35)
AST: 22 U/L (ref 0–37)
Alkaline Phosphatase: 68 U/L (ref 39–117)
BILIRUBIN TOTAL: 0.4 mg/dL (ref 0.2–1.2)
BUN: 9 mg/dL (ref 6–23)
CALCIUM: 9.2 mg/dL (ref 8.4–10.5)
CHLORIDE: 102 meq/L (ref 96–112)
CO2: 27 mEq/L (ref 19–32)
CREATININE: 0.64 mg/dL (ref 0.40–1.20)
GFR: 108.67 mL/min (ref >60.00)
Glucose, Bld: 120 mg/dL — ABNORMAL HIGH (ref 70–99)
Potassium: 4.1 mEq/L (ref 3.5–5.1)
Sodium: 135 mEq/L (ref 135–145)
Total Protein: 7 g/dL (ref 6.0–8.3)

## 2017-07-13 NOTE — ED Provider Notes (Signed)
[facility] DEPT Provider Note   CSN: [id number] Arrival date & time: 06/25/17  4580     History   Chief Complaint Chief Complaint  Patient presents with  . Abdominal Pain  . Constipation    HPI Carla Fletcher is a 41 y.o. female.  HPI  32yF with abdominal pain. LLQ. Onset a few days ago. Initially waxing/waning but now more persistent. No urinary complaints. No unusual bleeding/discharge. No fever or chills. No appreciable exacerbating or relieving factors.    Past Medical History:  Diagnosis Date  . Cancer [facility])    breast cancer, left    Patient Active Problem List   Diagnosis Date Noted  . Left lower quadrant pain 06/29/2017  . Bloating 06/29/2017  . Nausea without vomiting 06/29/2017    Past Surgical History:  Procedure Laterality Date  . BREAST LUMPECTOMY WITH RADIOACTIVE SEED LOCALIZATION Left 03/26/2017   Procedure: LEFT BREAST LUMPECTOMY X2 WITH RADIOACTIVE SEED LOCALIZATION;  Surgeon: Autumn Messing III, MD;  Location: [place];  Service: General;  Laterality: Left;    OB History    Gravida Para Term Preterm AB Living   0 0 0 0 0 0   SAB TAB Ectopic Multiple Live Births   0 0 0 0 0       Home Medications    Prior to Admission medications   Medication Sig Start Date End Date Taking? Authorizing Provider  ciprofloxacin (CIPRO) 500 MG tablet Take 1 tablet (500 mg total) by mouth 2 (two) times daily. Patient taking differently: Take 500 mg by mouth 2 (two) times daily. Has 4 days left 06/29/17   Zehr, Janett Billow D, PA-C  metroNIDAZOLE (FLAGYL) 500 MG tablet Take 1 tablet (500 mg total) by mouth 3 (three) times daily. Patient taking differently: Take 500 mg by mouth 3 (three) times daily. Has 4 days left 06/29/17   Zehr, Laban Emperor, PA-C  omeprazole (PRILOSEC) 20 MG capsule Take 1 capsule (20 mg total) by mouth daily. 07/02/17   Montine Circle, PA-C  sucralfate (CARAFATE) 1 g tablet Take 1 tablet (1 g total) by mouth 4 (four) times daily -  with meals  and at bedtime. 07/02/17   Montine Circle, PA-C  traMADol (ULTRAM) 50 MG tablet Take 1 tablet (50 mg total) by mouth every 6 (six) hours as needed. 06/25/17   Virgel Manifold, MD    Family History Family History  Problem Relation Age of Onset  . Hypertension Mother   . Recurrent abdominal pain Mother   . Healthy Father   . Hypertension Maternal Grandmother        died age 27  . Hypotension Paternal Grandfather   . Colon cancer Neg Hx   . Rectal cancer Neg Hx   . Esophageal cancer Neg Hx   . Liver cancer Neg Hx   . Stomach cancer Neg Hx     Social History Social History  Substance Use Topics  . Smoking status: Never Smoker  . Smokeless tobacco: Never Used  . Alcohol use No     Allergies   No known allergies   Review of Systems Review of Systems  All systems reviewed and negative, other than as noted in HPI.   Physical Exam Updated Vital Signs BP 126/75 (BP Location: Right Arm)   Pulse 81   Temp 98.3 F (36.8 C) (Oral)   Resp 16   LMP 05/30/2017   SpO2 100%   Physical Exam  Constitutional: She appears well-developed and well-nourished. No distress.  HENT:  Head:  Normocephalic and atraumatic.  Eyes: Conjunctivae are normal. Right eye exhibits no discharge. Left eye exhibits no discharge.  Neck: Neck supple.  Cardiovascular: Normal rate, regular rhythm and normal heart sounds.  Exam reveals no gallop and no friction rub.   No murmur heard. Pulmonary/Chest: Effort normal and breath sounds normal. No respiratory distress.  Abdominal: Soft. She exhibits no distension. There is tenderness.  Mild LLQ tenderness w/o rebound or guarding  Musculoskeletal: She exhibits no edema or tenderness.  Neurological: She is alert.  Skin: Skin is warm and dry.  Psychiatric: She has a normal mood and affect. Her behavior is normal. Thought content normal.  Nursing note and vitals reviewed.    ED Treatments / Results  Labs (all labs ordered are listed, but only abnormal  results are displayed) Labs Reviewed  URINALYSIS, ROUTINE W REFLEX MICROSCOPIC - Abnormal; Notable for the following:       Result Value   Color, Urine STRAW (*)    All other components within normal limits  LIPASE, BLOOD  COMPREHENSIVE METABOLIC PANEL  CBC  I-STAT BETA HCG BLOOD, ED ([facility], [facility], [facility] ONLY)    EKG  EKG Interpretation None       Radiology No results found.  Procedures Procedures (including critical care time)  Medications Ordered in ED Medications - No data to display   Initial Impression / Assessment and Plan / ED Course  I have reviewed the triage vital signs and the nursing notes.  Pertinent labs & imaging results that were available during my care of the patient were reviewed by me and considered in my medical decision making (see chart for details).     41yF with LLQ pain. Clinically suspect diverticulitis. Mild tenderness on exam. No peritonitis. Afebrile. No leukocytosis. I doubt emergent process. Presumptive tx with cipro/flagyl. PRN pain meds. OUtpt FU.   Final Clinical Impressions(s) / ED Diagnoses   Final diagnoses:  Left lower quadrant pain    New Prescriptions Discharge Medication List as of 06/25/2017  2:02 PM    START taking these medications   Details  traMADol (ULTRAM) 50 MG tablet Take 1 tablet (50 mg total) by mouth every 6 (six) hours as needed., Starting Mon 06/25/2017, Print    ciprofloxacin (CIPRO) 500 MG tablet Take 1 tablet (500 mg total) by mouth every 12 (twelve) hours., Starting Mon 06/25/2017, Print    metroNIDAZOLE (FLAGYL) 500 MG tablet Take 1 tablet (500 mg total) by mouth 3 (three) times daily., Starting Mon 06/25/2017, Print         Virgel Manifold, MD 07/13/17 1349

## 2017-07-16 ENCOUNTER — Telehealth: Payer: Self-pay | Admitting: Gastroenterology

## 2017-07-16 MED ORDER — SUCRALFATE 1 G PO TABS: 1.0000 g | ORAL_TABLET | Freq: Three times a day (TID) | ORAL | 0 refills | Status: DC

## 2017-07-16 NOTE — Telephone Encounter (Signed)
Spoke to Carla Fletcher.She would like to ask you to please send her medications to a new RX. Would like to use [facility] @ [facility].

## 2017-07-16 NOTE — Telephone Encounter (Signed)
Yes she needs to continue potassium, carafate, and miralax. Have her contact her pharmacy for refills and I will take care of it.  Ok that she stopped prilosec but if she develops reflux again or if symptoms worsen she is to call the office.

## 2017-07-16 NOTE — Telephone Encounter (Signed)
Prescriptions sent as requested

## 2017-07-17 ENCOUNTER — Telehealth: Payer: Self-pay | Admitting: Gastroenterology

## 2017-07-17 NOTE — Telephone Encounter (Signed)
The pt was advised to call PCP, not sure that the meds we prescribed should cause the rash

## 2017-07-18 ENCOUNTER — Other Ambulatory Visit: Payer: Self-pay

## 2017-07-18 MED ORDER — SUCRALFATE 1 G PO TABS: 1.0000 g | ORAL_TABLET | Freq: Three times a day (TID) | ORAL | 0 refills | Status: DC

## 2018-02-04 ENCOUNTER — Other Ambulatory Visit: Payer: Self-pay | Admitting: Obstetrics and Gynecology

## 2018-02-04 DIAGNOSIS — Z1231 Encounter for screening mammogram for malignant neoplasm of breast: Secondary | ICD-10-CM

## 2018-02-14 ENCOUNTER — Ambulatory Visit
Admission: RE | Admit: 2018-02-14 | Discharge: 2018-02-14 | Disposition: A | Discharge status: Home / Self Care | Payer: No Typology Code available for payment source | Source: Ambulatory Visit | Attending: Obstetrics and Gynecology | Admitting: Obstetrics and Gynecology

## 2018-02-14 ENCOUNTER — Encounter (HOSPITAL_COMMUNITY): Payer: Self-pay

## 2018-02-14 ENCOUNTER — Ambulatory Visit (HOSPITAL_COMMUNITY)
Admission: RE | Admit: 2018-02-14 | Discharge: 2018-02-14 | Disposition: A | Discharge status: Home / Self Care | Payer: Self-pay | Source: Ambulatory Visit | Attending: Obstetrics and Gynecology | Admitting: Obstetrics and Gynecology

## 2018-02-14 VITALS — BP 102/68

## 2018-02-14 DIAGNOSIS — Z1231 Encounter for screening mammogram for malignant neoplasm of breast: Secondary | ICD-10-CM

## 2018-02-14 DIAGNOSIS — Z1239 Encounter for other screening for malignant neoplasm of breast: Secondary | ICD-10-CM

## 2018-02-14 NOTE — Patient Instructions (Signed)
Explained breast self awareness with Carla Fletcher. Patient did not need a Pap smear today due to last Pap smear was in March 2018 per patient. Let her know [facility] will cover Pap smears every 3 years unless has a history of abnormal Pap smears. Referred patient to the [facility] for a screening mammogram. Appointment scheduled for Thursday, Feb 14, 2018 at 1040. Let patient know the Breast Center will follow up with her within the next couple weeks with results of mammogram by letter or phone. Carla Fletcher verbalized understanding.  Shadow Stiggers, Arvil Chaco, RN 11:03 AM

## 2018-02-14 NOTE — Progress Notes (Signed)
No complaints today.   Pap Smear: Pap smear not completed today. Last Pap smear was in March 2018 at the [facility] Department and normal per patient. Per patient has no history of an abnormal Pap smear. No Pap smear results are in Epic.   Physical exam: Breasts Right breast slightly larger than left breast due to history of left breast lumpectomy. No skin abnormalities bilateral breasts. No nipple retraction bilateral breasts. No nipple discharge bilateral breasts. No lymphadenopathy. No lumps palpated bilateral breasts. No complaints of pain or tenderness on exam. Referred patient to the [facility] for a screening mammogram. Appointment scheduled for Thursday, Feb 14, 2018 at 1040.        Pelvic/Bimanual No Pap smear completed today since last Pap smear was in March 2018 per patient. Pap smear not indicated per BCCCP guidelines.   Smoking History: Patient has never smoked.  Patient Navigation: Patient education provided. Access to services provided for patient through [facility] program. Spanish interpreter provided.   Colorectal Cancer Screening: Per patient has never had a colonoscopy completed. No complaints today. FIT Test given to patient to complete and return to B[facility].  Breast and Cervical Cancer Risk Assessment: Patient has no family history of breast cancer, known genetic mutations, or radiation treatment to the chest before age 70. Patient has no history of cervical dysplasia, immunocompromised, or DES exposure in-utero.  Used Spanish interpreter [employer] from [place].

## 2018-02-25 ENCOUNTER — Encounter (HOSPITAL_COMMUNITY): Payer: Self-pay | Admitting: *Deleted

## 2018-12-24 ENCOUNTER — Other Ambulatory Visit (HOSPITAL_COMMUNITY): Payer: Self-pay | Admitting: *Deleted

## 2018-12-24 DIAGNOSIS — Z1231 Encounter for screening mammogram for malignant neoplasm of breast: Secondary | ICD-10-CM

## 2019-01-30 IMAGING — US US BREAST BX W LOC DEV 1ST LESION IMG BX SPEC US GUIDE*R*
1 series · 9 of 9 positions shown · non-contrast
Comparison: Previous exam(s).

ADDENDUM:
Pathology revealed FIBROADENOMA of the Right breast, 9:30 o'clock, 4
CMFN. This was found to be concordant byDr. Gerlan Ramunno. Pathology
revealed COMPLEX SCLEROSING LESION of the Left breast. This was
found to be concordant by Dr. Gerlan Ramunno, with excision
recommended. Pathology results were discussed with the patient by
Representative. The patient reported doing well after the biopsies
with tenderness and bruising at the sites. Post biopsy instructions
and care were reviewed and questions were answered. The patient was
encouraged to call The [REDACTED] for any
additional concerns. Surgical consultation has been arranged with
patient is scheduled for a left breast stereotatic biopsy at The

Pathology results reported by Deeqa Rayaan Adlaho, RN on 02/20/2017.
CLINICAL DATA: 40-year-old patient presents for a a scheduled right
breast biopsy to evaluate a palpable mass in the [DATE] position of
the right breast. Biopsy was recommended following the diagnostic
mammogram and ultrasound evaluation on 02/08/2017. A Spanish
interpreter was present in the ultrasound room throughout the
procedure today.
EXAM:
ULTRASOUND GUIDED RIGHT BREAST CORE NEEDLE BIOPSY

[Series 1: us breast bx w loc dev 1st lesion img bx spec us g · 0.06mm/px · 9 of 9 slices shown]
[im 1/9]
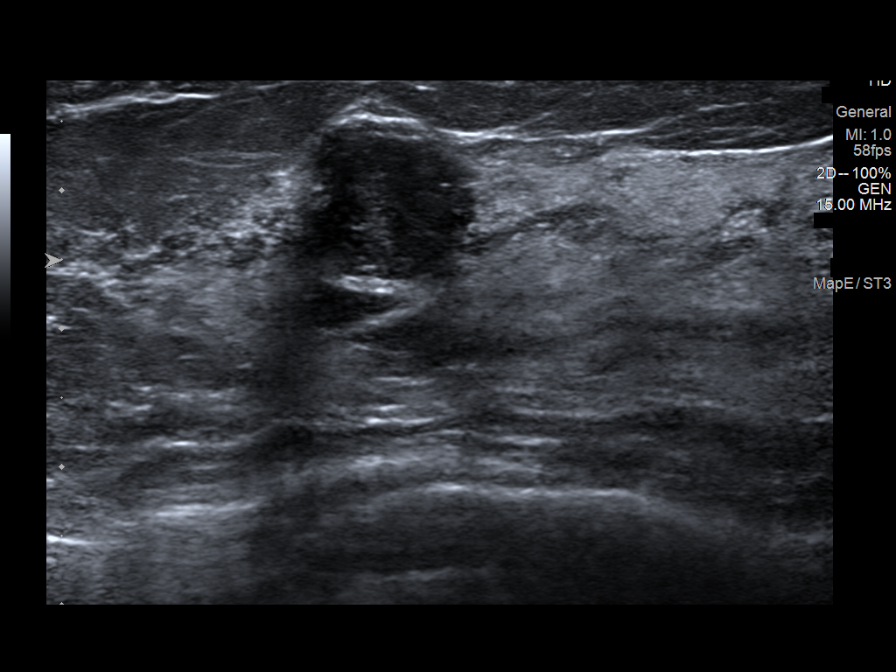
[im 2/9]
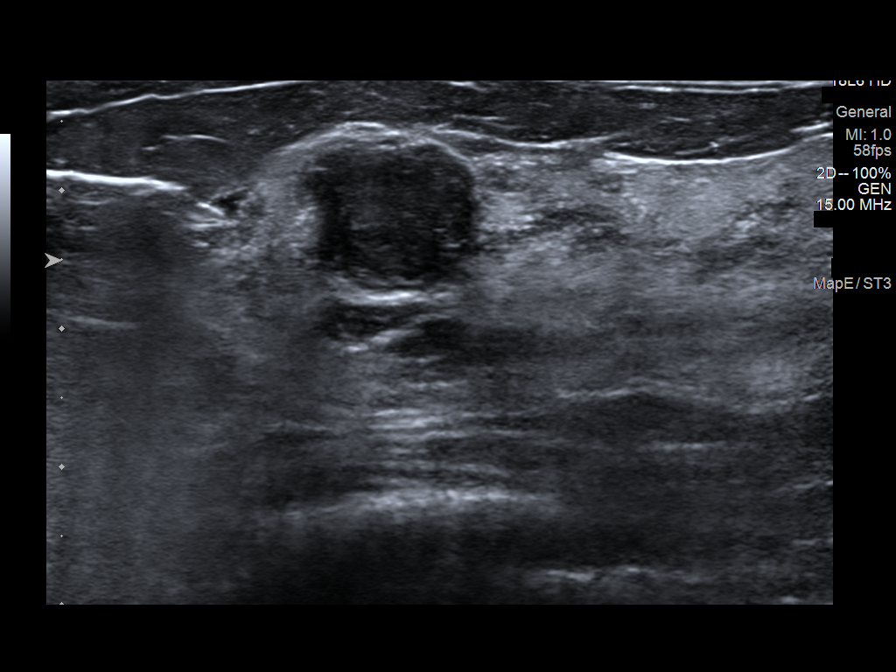
[im 3/9]
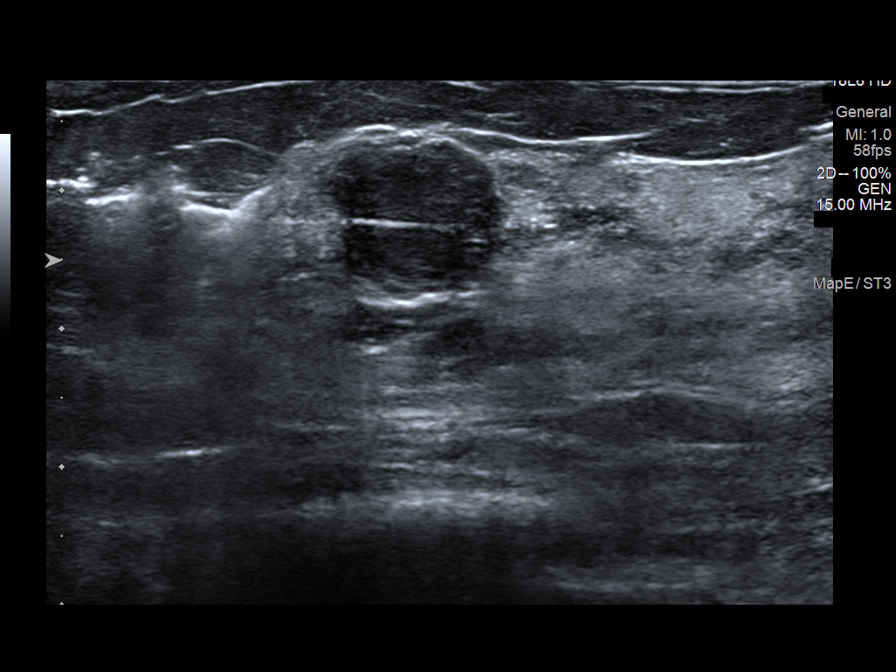
[im 4/9]
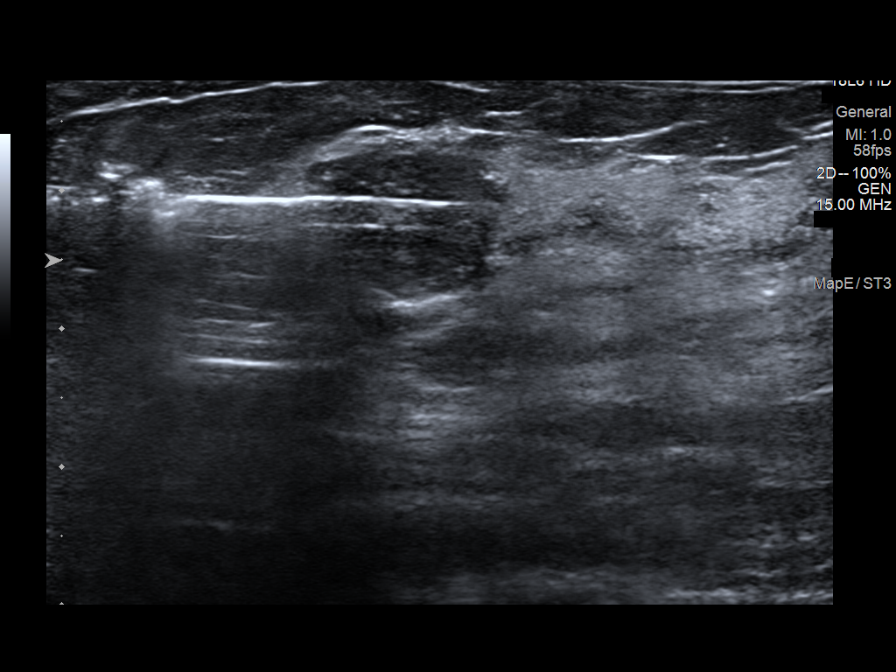
[im 5/9]
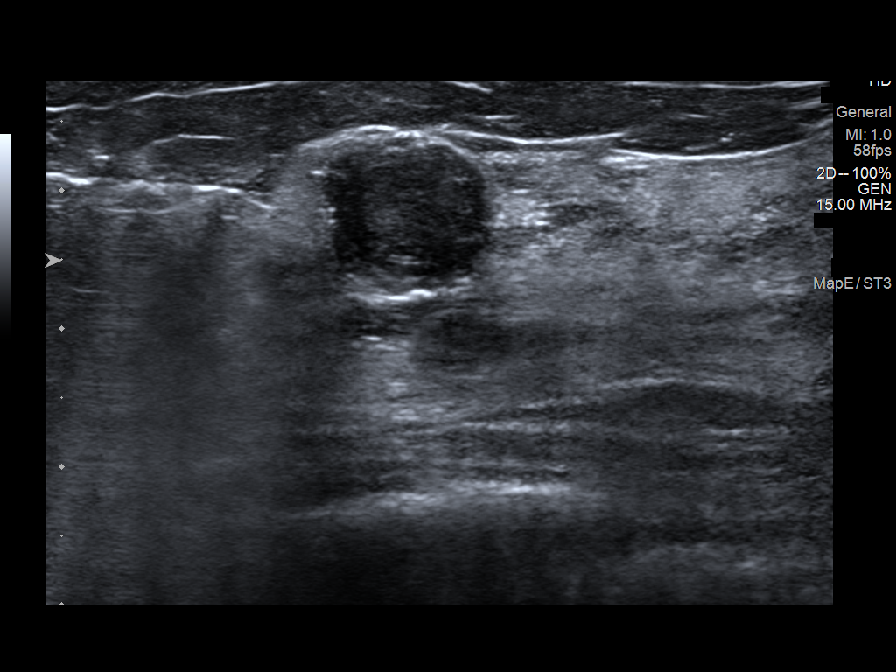
[im 6/9]
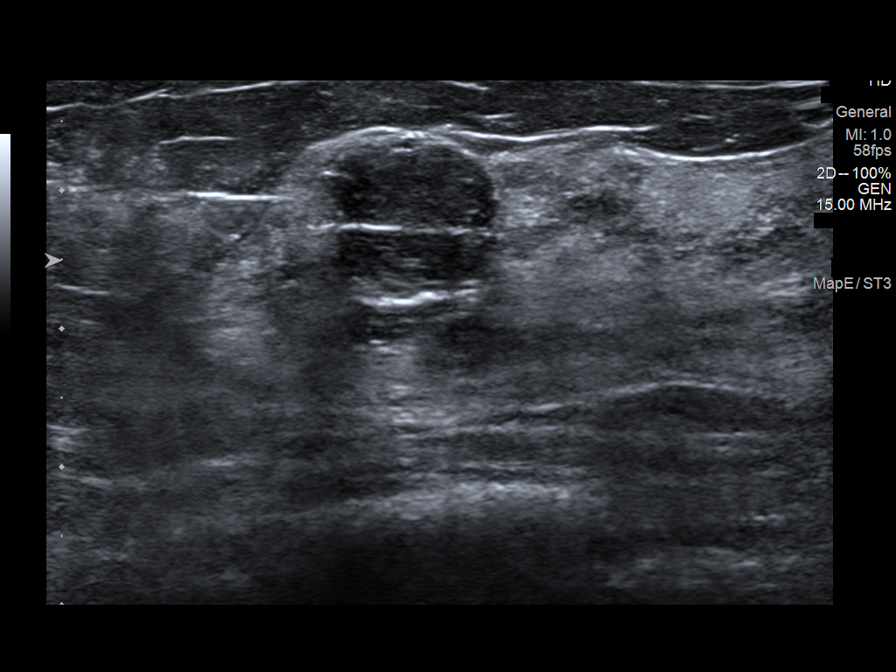
[im 7/9]
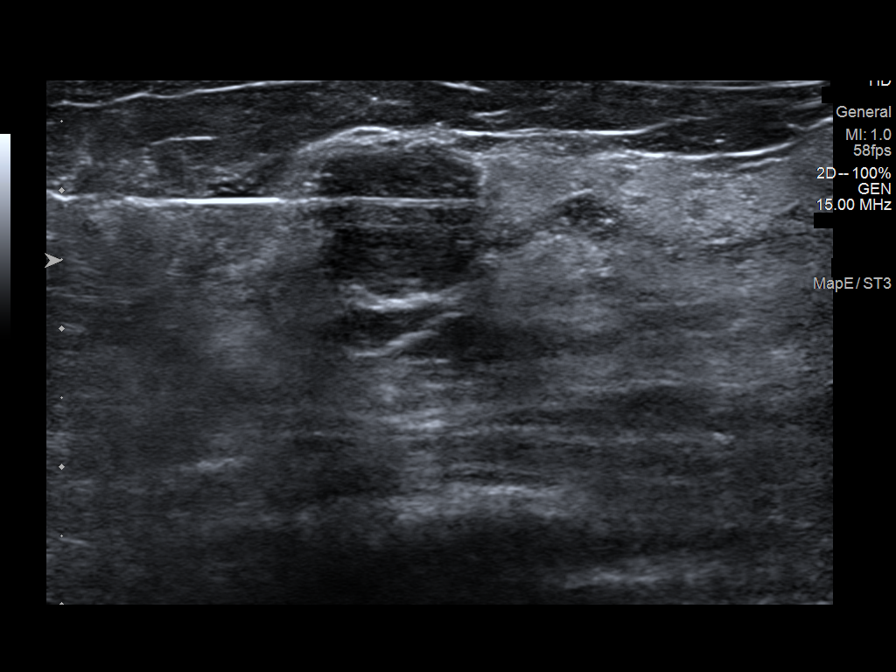
[im 8/9]
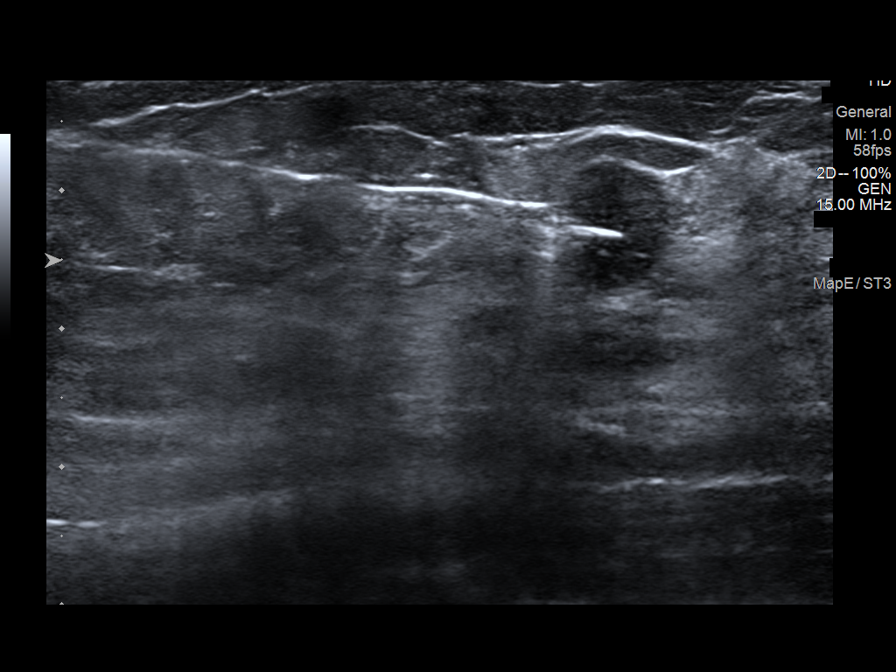
[im 9/9]
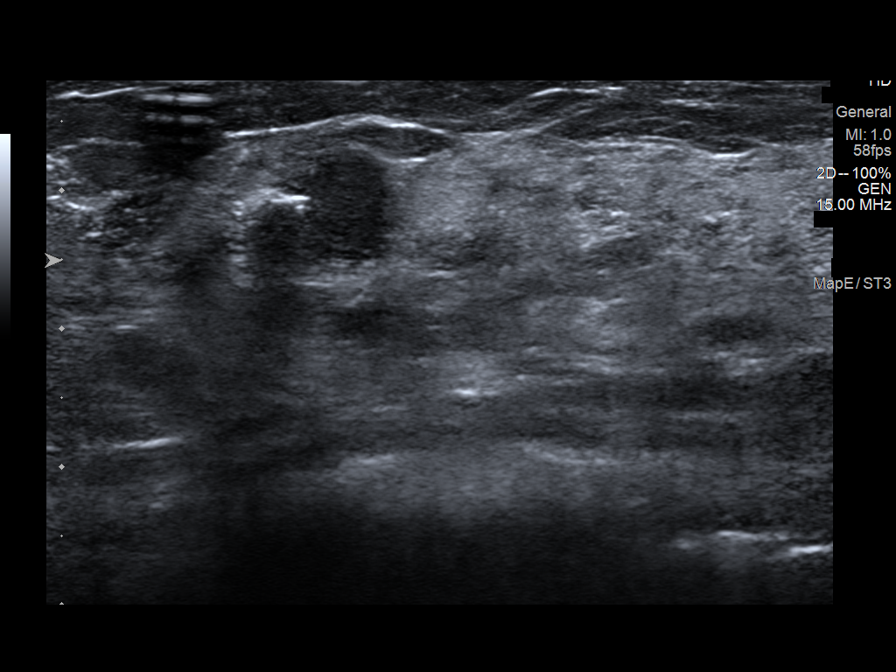

[9 of 9 positions shown; findings below may reference images not displayed]



Lesion quadrant: Upper-outer quadrant

Using sterile technique and 1% Lidocaine as local anesthetic, under
direct ultrasound visualization, a 12 gauge Nabela device was
used to perform biopsy of a mass in the [DATE] position of the right
breast 4 cm from the nipple using a lateral to medial approach. At
the conclusion of the procedure a tissue marker clip was deployed
into the biopsy cavity. Follow up 2 view mammogram was performed and
dictated separately.
IMPRESSION: Ultrasound guided biopsy of the right breast. No apparent
complications.

## 2019-01-30 IMAGING — MG MM CLIP PLACEMENT
6 series · 6 of 14 positions shown · non-contrast
Comparison: Previous exam(s).

CLINICAL DATA: Today, a palpable mass in the [DATE] position of the
right breast was biopsied under ultrasound guidance and an area of
architectural distortion in the upper-outer quadrant of the left
breast was biopsied under stereotactic guidance.

EXAM:
DIAGNOSTIC BILATERAL MAMMOGRAM POST ULTRASOUND AND STEREOTACTIC
BIOPSY

[R ML]
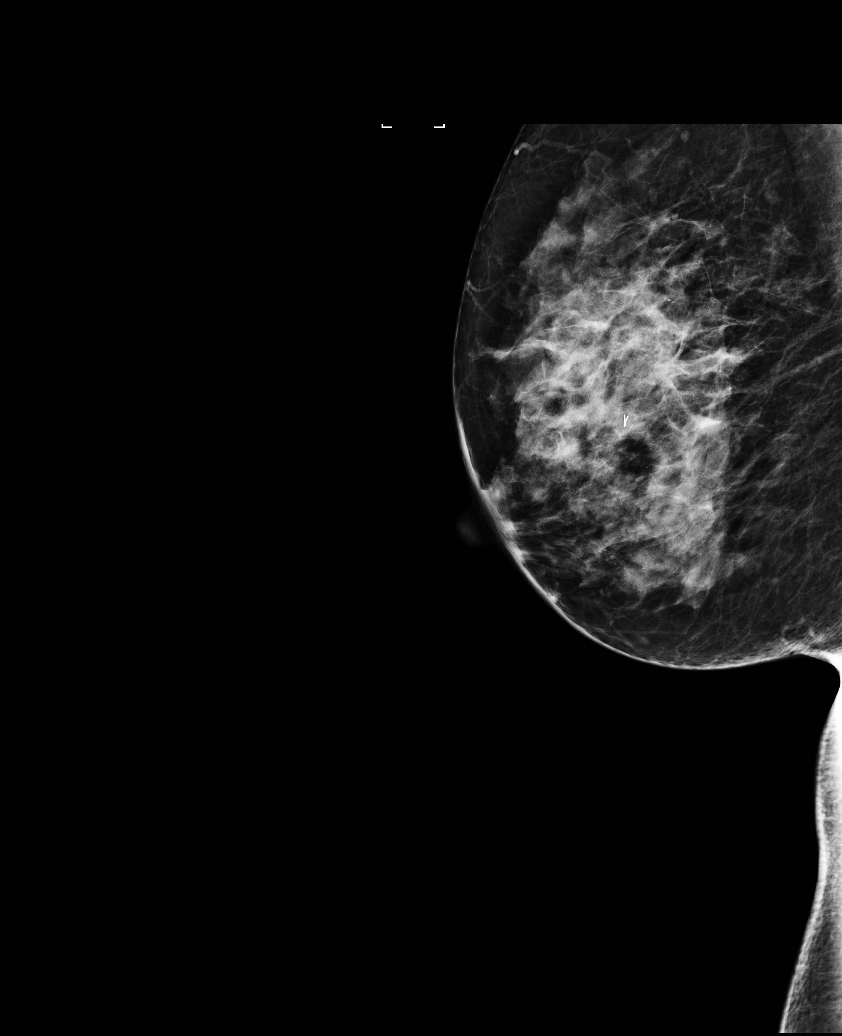

[R ML synth-2D]
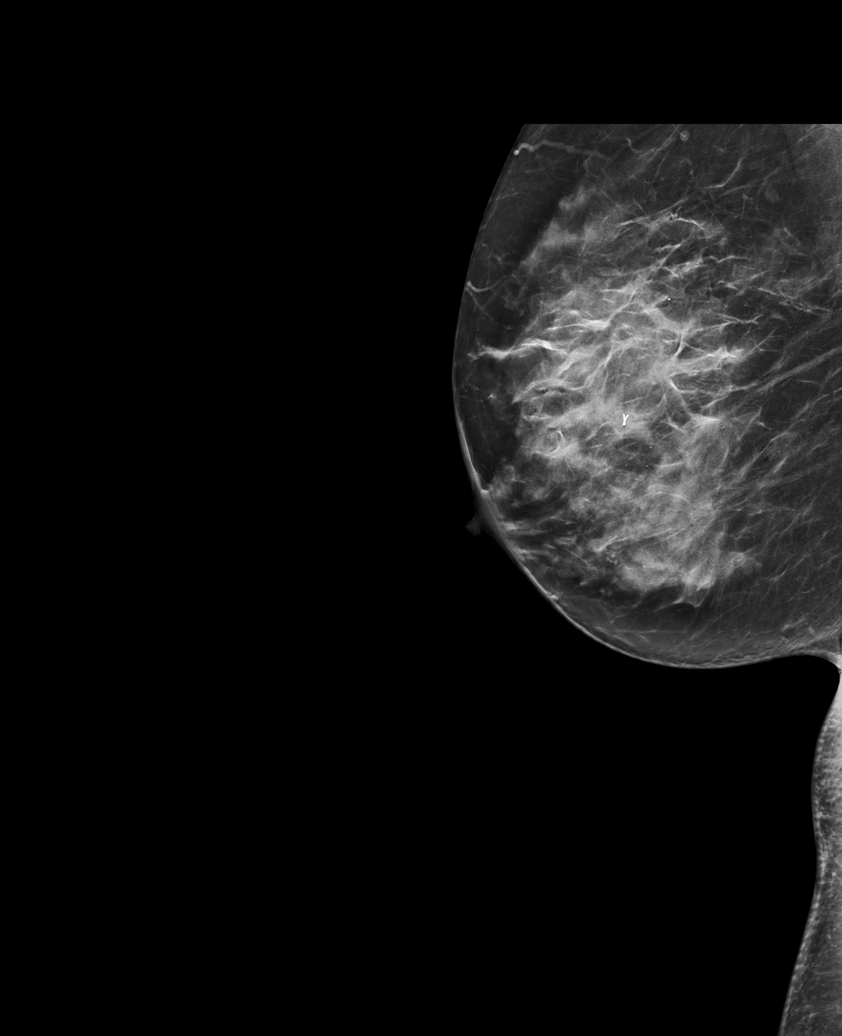

[R CC]
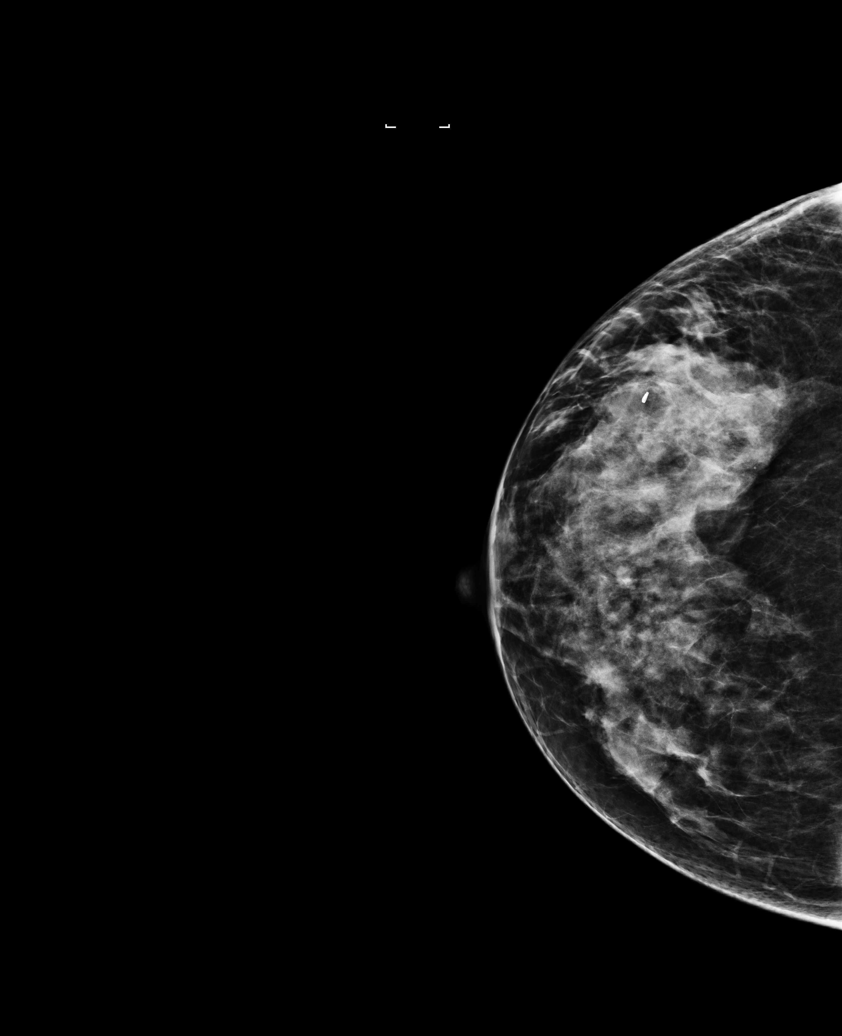

[R CC synth-2D]
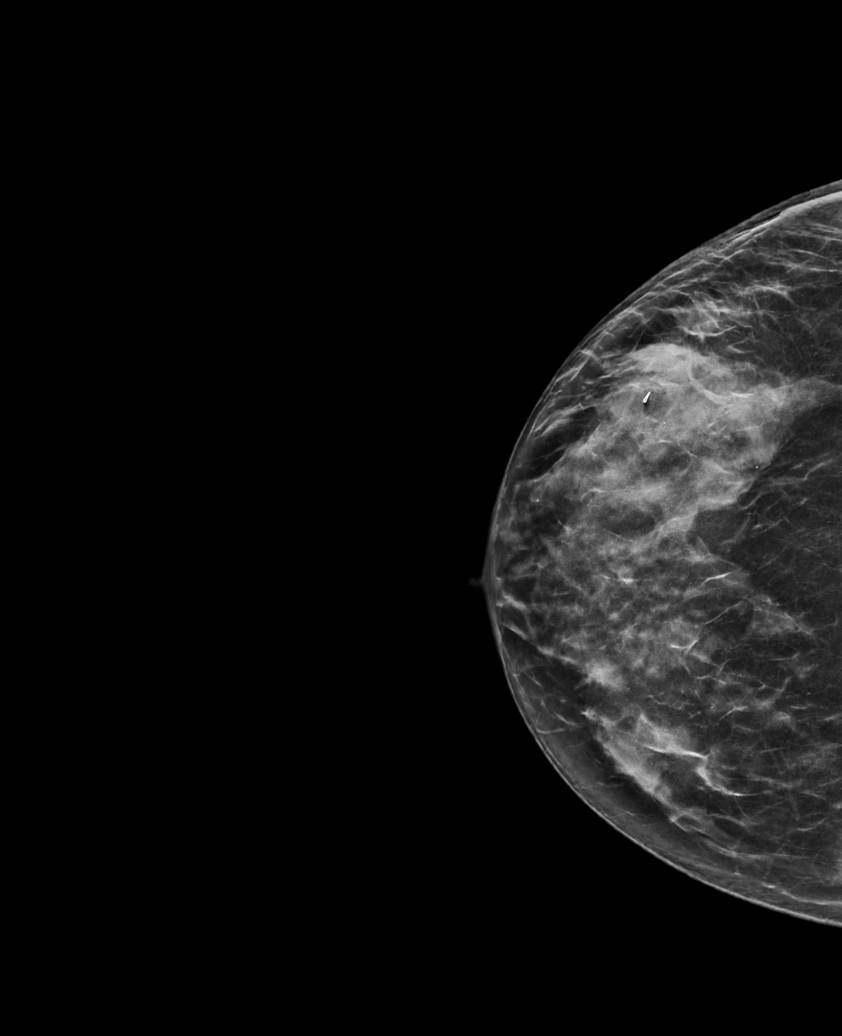

[R ML tomo · tomo slice 38/75.0]
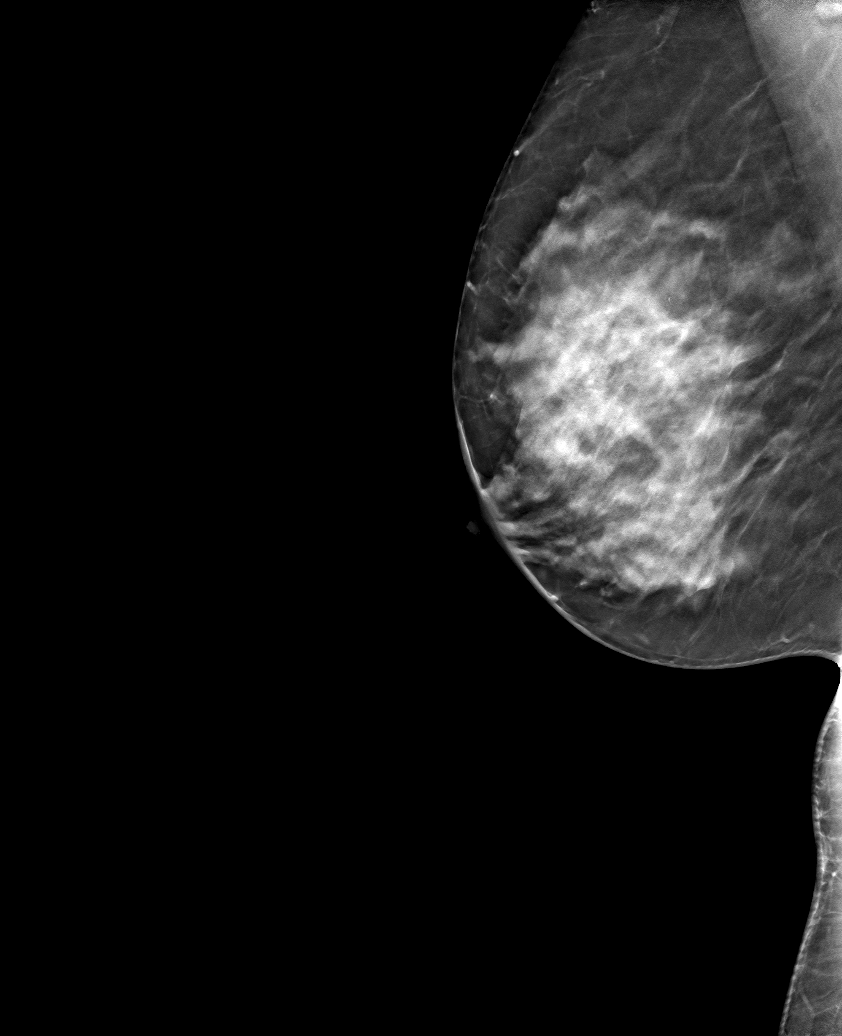

[R CC tomo · tomo slice 37/74.0]
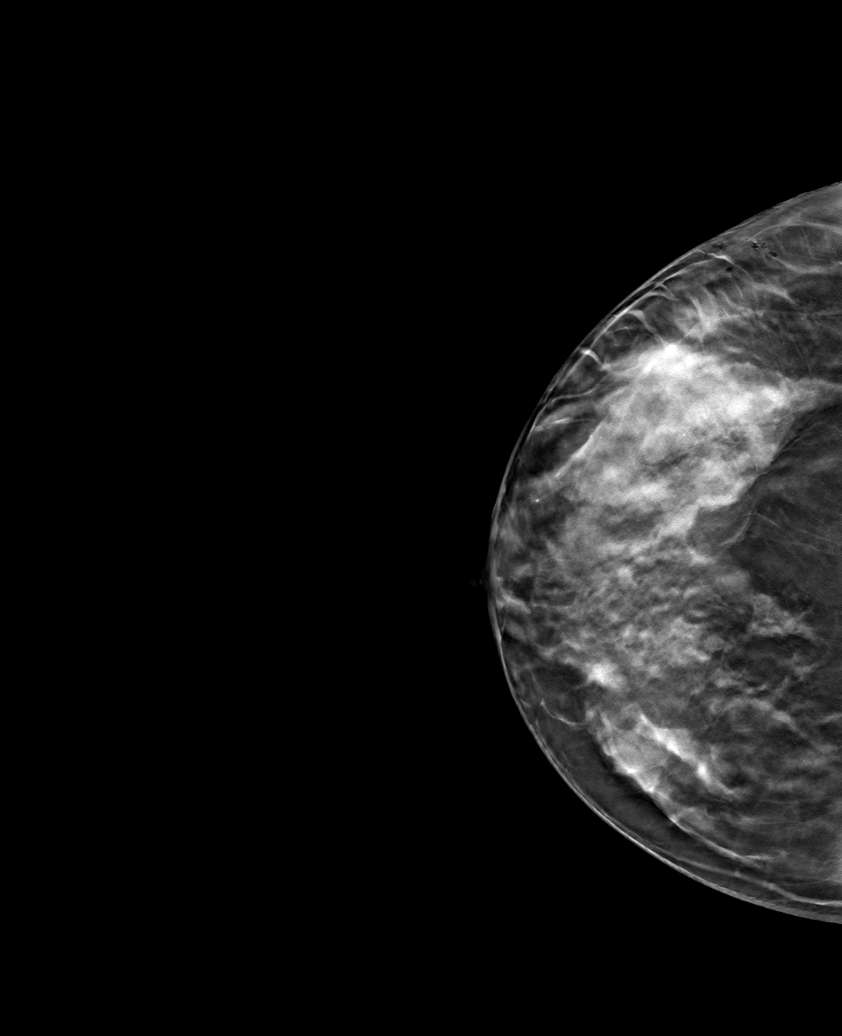

[6 of 14 positions shown; findings below may reference images not displayed]

FINDINGS: Mammographic images were obtained following ultrasound guided biopsy
of a mass in the [DATE] position of the right breast and following
stereotactic biopsy of an area of architectural distortion upper
outer quadrant of the left breast. The ribbon shaped biopsy clip
appears satisfactorily positioned within the right breast mass. It
is noted that the biopsy clip could be seen within the right breast
mass at the conclusion of the ultrasound-guided biopsy as well.

A coil shaped biopsy clip is satisfactorily positioned within the
area of architectural distortion in the upper-outer quadrant of the
left breast.
IMPRESSION: 1. Satisfactory position of ribbon shaped biopsy clip in the right
breast.
2. Satisfactory position of coil shaped biopsy clip in the left
breast.
Final Assessment: Post Procedure Mammograms for Marker Placement

## 2019-04-03 ENCOUNTER — Ambulatory Visit
Admission: RE | Admit: 2019-04-03 | Discharge: 2019-04-03 | Disposition: A | Discharge status: Home / Self Care | Payer: No Typology Code available for payment source | Source: Ambulatory Visit | Attending: Obstetrics and Gynecology | Admitting: Obstetrics and Gynecology

## 2019-04-03 ENCOUNTER — Ambulatory Visit (HOSPITAL_COMMUNITY)
Admission: RE | Admit: 2019-04-03 | Discharge: 2019-04-03 | Disposition: A | Discharge status: Home / Self Care | Payer: No Typology Code available for payment source | Source: Ambulatory Visit | Attending: Obstetrics and Gynecology | Admitting: Obstetrics and Gynecology

## 2019-04-03 ENCOUNTER — Encounter (HOSPITAL_COMMUNITY): Payer: Self-pay | Admitting: *Deleted

## 2019-04-03 ENCOUNTER — Other Ambulatory Visit: Payer: Self-pay

## 2019-04-03 ENCOUNTER — Encounter (HOSPITAL_COMMUNITY): Payer: Self-pay

## 2019-04-03 DIAGNOSIS — Z1239 Encounter for other screening for malignant neoplasm of breast: Secondary | ICD-10-CM

## 2019-04-03 DIAGNOSIS — Z1231 Encounter for screening mammogram for malignant neoplasm of breast: Secondary | ICD-10-CM

## 2019-04-03 NOTE — Patient Instructions (Signed)
Explained breast self awareness with Carla Fletcher. Patient did not need a Pap smear today due to last Pap smear was in March 2018 per patient. Let her know B[facility]CP will cover Pap smears every 3 years unless has a history of abnormal Pap smears. Referred patient to the [facility] for a screening mammogram. Appointment scheduled for Thursday, April 03, 2019 at 1110. Patient aware of appointment and will be there. Let patient know the Breast Center will follow up with her within the next couple weeks with results of mammogram by letter or phone. Let patient know that the [facility] will call her with her follow-up appointment. Carla Fletcher verbalized understanding.  Shirley Muscat, RN 10:05 AM    [place]

## 2019-04-03 NOTE — Progress Notes (Signed)
Complaints of tenderness around menstrual period. Complaints of painful menstrual periods x 6 months. Will refer to the Center for [employer]. Explained to patient that visit will not be covered by [facility] and told her about the [employer] .  Pap Smear: Pap smear not completed today. Last Pap smear was in March 2018 at the [facility] Department and normal per patient. Per patient has no history of an abnormal Pap smear. No Pap smear results are in Epic.  Physical exam: Breasts Breasts symmetrical. No skin abnormalities bilateral breasts. No nipple retraction bilateral breasts. No nipple discharge bilateral breasts. No lymphadenopathy. No lumps palpated bilateral breasts. Complaints of breast tenderness on exam and per patient states she is due for her menstrual cycle in one week. Referred patient to the [facility] for a screening mammogram. Appointment scheduled for Thursday, April 03, 2019 at 1110.      Pelvic/Bimanual No Pap smear completed today since last Pap smear was in March 2018 per patient. Pap smear not indicated per BCCCP guidelines.   Smoking History: Patient has never smoked.  Patient Navigation: Patient education provided. Access to services provided for patient through [facility] program. Spanish interpreter provided.   Breast and Cervical Cancer Risk Assessment: Patient has no family history of breast cancer, known genetic mutations, or radiation treatment to the chest before age 31. Patient has no history of cervical dysplasia, immunocompromised, or DES exposure in-utero.  Risk Assessment    Risk Scores      04/03/2019   Last edited by: Armond Hang, LPN   5-year risk: 1.3 %   Lifetime risk: 12.3 %         Used Spanish interpreter Cresenciano Genre from [place].

## 2019-04-14 ENCOUNTER — Other Ambulatory Visit: Payer: Self-pay

## 2019-04-14 ENCOUNTER — Other Ambulatory Visit: Payer: Self-pay | Admitting: Obstetrics and Gynecology

## 2019-04-14 ENCOUNTER — Inpatient Hospital Stay: Payer: Self-pay | Attending: Obstetrics and Gynecology | Admitting: *Deleted

## 2019-04-14 VITALS — BP 108/70 | Temp 98.3°F | Ht 63.0 in | Wt 141.0 lb

## 2019-04-14 DIAGNOSIS — Z0000 Encounter for general adult medical examination without abnormal findings: Secondary | ICD-10-CM

## 2019-04-14 NOTE — Progress Notes (Signed)
[facility] Initial Screening   Interpreter- [name], [employer]   Clinical Measurement:  Height: 63 in Weight: 141 lb  Blood Pressure: 108/70. Fasting Labs Drawn Today, will review with patient when they result.   Medical History: Patient states that she does not know if she has high cholesterol. She thinks that the last time she had blood work done a few years ago that the doctor told her that her cholesterol was slightly elevated. Patient states that she has not been diagnosed wit high blood pressure or diabetes.   Medications:  Patient states that she does not take any medications to lower cholesterol, blood pressure or blood sugar. Patient states that she does not take an aspirin daily to prevent heart attack or stroke.    Blood pressure, self measurement: Patient states that she does not measure blood pressure at home and has never been told to measure blood pressure.     Nutrition: Patient states that she consumes 2 cups of fruit a day and 0 cups of vegetables a day. Patient states that she does not consume fish at least twice a week. Patient states that less than half of the grain products she consumes are whole grains. Patient drinks less than 36 ounces of beverages with added sugars weekly and she is currently not watching her sodium/salt intake. In the last 7 days she has had 0 alcoholic beverages.     Physical activity:  Patient gets 0 minutes of moderate physical activity and 0 minutes of vigorous physical activity in a week.  Smoking status:  Patient states that she has never smoked tobacco.   Quality of life: Patient states that over the past 2 weeks she has not had any days where she has had little interest or pleasure in doing things or felt down, depressed or hopeless.    Risk reduction and counseling: Patient states that she would like to improve diet and exercise. Spoke with the patient about incorporating vegetables in to her diet for a goal of 3 servings daily. Also spoke  with patient about switching from regular pasta to whole grain pasta and also about portion control. Told patient that we would wait until her labs came back to see what else we would need to work on with diet. Spoke with patient about adding exercise to her daily routine to get 20-30 minutes a day.    Navigation:  I will notify patient of lab results.  Patient is aware of 2 more health coaching sessions and a follow up.  Time- 45 minutes

## 2019-04-16 LAB — LIPID PANEL
Chol/HDL Ratio: 4.4 ratio (ref 0.0–4.4)
Cholesterol, Total: 240 mg/dL — ABNORMAL HIGH (ref 100–199)
HDL: 55 mg/dL (ref >39)
LDL Calculated: 149 mg/dL — ABNORMAL HIGH (ref 0–99)
Triglycerides: 180 mg/dL — ABNORMAL HIGH (ref 0–149)
VLDL Cholesterol Cal: 36 mg/dL (ref 5–40)

## 2019-04-16 LAB — GLUCOSE, FASTING: Glucose, Plasma: 106 mg/dL — ABNORMAL HIGH (ref 65–99)

## 2019-04-16 LAB — HEMOGLOBIN A1C
Est. average glucose Bld gHb Est-mCnc: 131 mg/dL
Hgb A1c MFr Bld: 6.2 % — ABNORMAL HIGH (ref 4.8–5.6)

## 2019-04-22 ENCOUNTER — Telehealth: Payer: Self-pay

## 2019-04-22 NOTE — Telephone Encounter (Signed)
Health coaching 2   Interpreter- [name], [facility]   Labs- 240 cholesterol , 149 LDL cholesterol , 180 triglycerides , 55 HDL cholesterol , 6.2 hemoglobin A1C, 106 mean plasma glucose. Patient understands and is aware of her lab results.   Goals-  Spoke with patient in detail about her lab results and referral to Internal Medicine for elevated labs. Spoke with patient extensively about diet and exercise. Patient is not currently exercising. Set goal for patient to start walking 20-30 minutes daily. Patient also set goal to eat 3 servings of vegetables daily  and 2 servings of fish per week. Spoke with patient also about reducing the amount of carbohydrates in diet and switching to reduced fat or fat-free dairy products. Also suggested that patient try whole wheat pasta and brown rice.    Navigation:  Patient is aware of 1 more health coaching sessions and a follow up. Patient is scheduled for follow-up appointment on April 23, 2019 @ 9:45 am at [facility] Internal [facility].   Time- 27 minutes

## 2019-04-23 ENCOUNTER — Other Ambulatory Visit: Payer: Self-pay

## 2019-04-23 ENCOUNTER — Ambulatory Visit (INDEPENDENT_AMBULATORY_CARE_PROVIDER_SITE_OTHER): Payer: Self-pay | Admitting: Internal Medicine

## 2019-04-23 ENCOUNTER — Encounter: Payer: Self-pay | Admitting: Internal Medicine

## 2019-04-23 VITALS — BP 121/71 | HR 90 | Temp 98.3°F | Ht 63.0 in | Wt 139.7 lb

## 2019-04-23 DIAGNOSIS — R7303 Prediabetes: Secondary | ICD-10-CM

## 2019-04-23 DIAGNOSIS — R252 Cramp and spasm: Secondary | ICD-10-CM

## 2019-04-23 DIAGNOSIS — E7800 Pure hypercholesterolemia, unspecified: Secondary | ICD-10-CM

## 2019-04-23 NOTE — Progress Notes (Signed)
   CC: Cholesterol management  HPI:  [name] is a 43 y.o. with PMHx as below, referred to clinic by [facility] for hypercholesterolemia management and to establish care. Please refer to problem based charting for further details and assessment of plan of current problem and chronic medical conditions. Patient is spanish speaker and Interpreter assisted for obtaining Hx.   No past medical history on file.  PMHx: Left breast mass/lump s/p lumpectomy  Review of Systems:   Review of Systems  Constitutional: Negative for chills and fever.  Respiratory: Negative for cough and shortness of breath.   Cardiovascular: Negative for chest pain and leg swelling.  Gastrointestinal: Negative for abdominal pain, diarrhea, nausea and vomiting.  Genitourinary: Negative for dysuria, frequency and hematuria.  Musculoskeletal:       Muscle cramp  Neurological: Negative for dizziness.   Family Hx: Mother with HTN  No family Hx of heart attack No family Hx of sudden death or premature cardiac death  Social Hx:  Lives in her sister in [place], she is single, no children Never smoked No illicit drug use No alcohol use   Physical Exam:  Vitals:   04/23/19 0932  BP: 121/71  Pulse: 90  Temp: 98.3 F (36.8 C)  TempSrc: Oral  SpO2: 100%  Weight: 139 lb 11.2 oz (63.4 kg)  Height: 5\' 3"  (1.6 m)   Physical Exam  Constitutional: Appears well-developed and well-nourished. No distress.  HENT:  Head: Normocephalic and atraumatic.  Eyes: Conjunctivae are normal.  Cardiovascular: Normal rate, regular rhythm and normal heart sounds.  Respiratory: Effort normal and breath sounds normal. No respiratory distress. No wheezes.  GI: Soft. Bowel sounds are normal. No distension. There is no tenderness.  Musculoskeletal: No edema.  Neurological: Is alert and oriented x3 Skin: Not diaphoretic. No erythema.  Psychiatric: Normal mood and affect. Behavior is normal. Judgment and thought  content normal.   Assessment & Plan:   See Encounters Tab for problem based charting.  Patient  discussed with Dr. Angelia Mould

## 2019-04-23 NOTE — Patient Instructions (Addendum)
Su prueba de colesterol y diabetes fue levemente elevada. No necesita medicamentos para eso en este momento, pero trate de llevar una dieta y ejercicio bajos en grasas y azcares (puede comenzar caminando durante 20 minutos 3 veces por semana). Te envo al laboratorio para un anlisis de [place] y te Solicitor si los resultados vuelven anormales. Regrese a Copy en 6 meses para repetir el anlisis de sangre o antes si es necesario. Tu presin arterial hoy fue genial. Su prueba de laboratorio anterior: colesterol total 240, LDL: 149, HDL: 55, HbA1c: 6.2

## 2019-04-24 LAB — BMP8+ANION GAP
Anion Gap: 14 mmol/L (ref 10.0–18.0)
BUN/Creatinine Ratio: 21 (ref 9–23)
BUN: 13 mg/dL (ref 6–24)
CO2: 22 mmol/L (ref 20–29)
Calcium: 9.1 mg/dL (ref 8.7–10.2)
Chloride: 102 mmol/L (ref 96–106)
Creatinine, Ser: 0.63 mg/dL (ref 0.57–1.00)
GFR calc Af Amer: 128 mL/min/{1.73_m2} (ref >59)
GFR calc non Af Amer: 111 mL/min/{1.73_m2} (ref >59)
Glucose: 92 mg/dL (ref 65–99)
Potassium: 4.4 mmol/L (ref 3.5–5.2)
Sodium: 138 mmol/L (ref 134–144)

## 2019-04-25 DIAGNOSIS — R7303 Prediabetes: Secondary | ICD-10-CM | POA: Insufficient documentation

## 2019-04-25 DIAGNOSIS — R252 Cramp and spasm: Secondary | ICD-10-CM | POA: Insufficient documentation

## 2019-04-25 DIAGNOSIS — E7800 Pure hypercholesterolemia, unspecified: Secondary | ICD-10-CM | POA: Insufficient documentation

## 2019-04-25 NOTE — Addendum Note (Signed)
Addended byDewayne Hatch on: 04/25/2019 01:09 PM   Modules accepted: Level of Service

## 2019-04-25 NOTE — Assessment & Plan Note (Signed)
-  Checking BMP today  ADDENUM: BMP was normal. Recommending drinking adequate amount of water. Will monitor.

## 2019-04-25 NOTE — Assessment & Plan Note (Signed)
Patient referred to [facility] from [facility] center for management of abnormal lipid panel. Lipid Panel     Component Value Date/Time   CHOL 240 (H) 04/14/2019 0000   TRIG 180 (H) 04/14/2019 0000   HDL 55 04/14/2019 0000   CHOLHDL 4.4 04/14/2019 0000   LDLCALC 149 (H) 04/14/2019 0000   ASCVD risk score with calculated 10y cardiovascular death risk of 1.1%. No occasion for starting statin this point. -Recommended statin modification: Exercise and low-fat diet -Follow-up in clinic in 6 months

## 2019-04-25 NOTE — Progress Notes (Signed)
Patient has been scheduled for follow-up with PCP.

## 2019-04-25 NOTE — Assessment & Plan Note (Signed)
Patient referred to [facility] from [facility] center for evaluation of hemoglobin A1c of 6.2. Prediabetic. -Recommended lifestyle modification including decreasing carbohydrates intake and follow-up in clinic in 6 months to repeat A1c

## 2019-04-29 NOTE — Progress Notes (Signed)
Internal Medicine Clinic Attending  Case discussed with Dr. Masoudi  at the time of the visit.  We reviewed the resident's history and exam and pertinent patient test results.  I agree with the assessment, diagnosis, and plan of care documented in the resident's note.  

## 2019-05-05 ENCOUNTER — Other Ambulatory Visit: Payer: Self-pay

## 2019-05-05 ENCOUNTER — Ambulatory Visit (INDEPENDENT_AMBULATORY_CARE_PROVIDER_SITE_OTHER): Payer: Self-pay | Admitting: Obstetrics & Gynecology

## 2019-05-05 ENCOUNTER — Encounter: Payer: Self-pay | Admitting: Obstetrics & Gynecology

## 2019-05-05 DIAGNOSIS — N945 Secondary dysmenorrhea: Secondary | ICD-10-CM

## 2019-05-05 MED ORDER — NORGESTIMATE-ETH ESTRADIOL 0.25-35 MG-MCG PO TABS
1.0000 | ORAL_TABLET | Freq: Every day | ORAL | 3 refills | Status: DC
Start: 2019-05-05 — End: 2020-12-08

## 2019-05-05 MED ORDER — NORGESTIMATE-ETH ESTRADIOL 0.25-35 MG-MCG PO TABS: 1.0000 | ORAL_TABLET | Freq: Every day | ORAL | 3 refills | Status: DC

## 2019-05-05 NOTE — Progress Notes (Signed)
Attempted to call with a interpreter @ 235 pm no answer telephone kept ringing. 2nd attempt @242  with pacific interpreter and telephone continued to ring until call ended.  I connected with  Carla Fletcher on 05/05/19 at  2:55 PM EDT by telephone and verified that I am speaking with the correct person using two identifiers.   I discussed the limitations, risks, security and privacy concerns of performing an evaluation and management service by telephone and the availability of in person appointments. I also discussed with the patient that there may be a patient responsible charge related to this service. The patient expressed understanding and agreed to proceed.  Alric Seton, [place] 05/05/2019  2:56 PM

## 2019-05-05 NOTE — Progress Notes (Addendum)
TELEHEALTH VIRTUAL GYNECOLOGY VISIT ENCOUNTER NOTE  I connected with Carla Fletcher on 05/05/19 at  2:55 PM EDT by telephone at home and verified that I am speaking with the correct person using two identifiers.   I discussed the limitations, risks, security and privacy concerns of performing an evaluation and management service by telephone and the availability of in person appointments. I also discussed with the patient that there may be a patient responsible charge related to this service. The patient expressed understanding and agreed to proceed.   History:  Carla Fletcher is a 43 y.o. G0P0000 female being evaluated today for dysmenorrhea.  LMP June. Pt reports monthly cycles. Her cycles last 5days. She's been drinking mint tea and the menses this month were not so bad, She denies any abnormal vaginal discharge other concerns.  Pt takes Tylenol for the pain. She was prev had an ulcer so has been told not to take NSIADs.         No past medical history on file. Past Surgical History:  Procedure Laterality Date  . BREAST EXCISIONAL BIOPSY Left 2018   benign  . BREAST LUMPECTOMY WITH RADIOACTIVE SEED LOCALIZATION Left 03/26/2017   Procedure: LEFT BREAST LUMPECTOMY X2 WITH RADIOACTIVE SEED LOCALIZATION;  Surgeon: Jovita Kussmaul, MD;  Location: [facility];  Service: General;  Laterality: Left;  . BREAST SURGERY     The following portions of the patient's history were reviewed and updated as appropriate: allergies, current medications, past family history, past medical history, past social history, past surgical history and problem list.   Health Maintenance:  Normal pap and negative HRHPV on 2 years prev.  Normal mammogram on 04/03/2019.   Review of Systems:  Pertinent items noted in HPI and remainder of comprehensive ROS otherwise negative.  Physical Exam:   General:  Alert, oriented and cooperative.   Mental Status: Normal mood and affect perceived. Normal judgment and  thought content.  Physical exam deferred due to nature of the encounter  Labs and Imaging Results for orders placed or performed in visit on 04/23/19 (from the past 336 hour(s))  BMP8+Anion Gap   Collection Time: 04/23/19 10:45 AM  Result Value Ref Range   Glucose 92 65 - 99 mg/dL   BUN 13 6 - 24 mg/dL   Creatinine, Ser 0.63 0.57 - 1.00 mg/dL   GFR calc non Af Amer 111 >59 mL/min/1.73   GFR calc Af Amer 128 >59 mL/min/1.73   BUN/Creatinine Ratio 21 9 - 23   Sodium 138 134 - 144 mmol/L   Potassium 4.4 3.5 - 5.2 mmol/L   Chloride 102 96 - 106 mmol/L   CO2 22 20 - 29 mmol/L   Anion Gap 14.0 10.0 - 18.0 mmol/L   Calcium 9.1 8.7 - 10.2 mg/dL   No results found.    Assessment and Plan:     Dysmenorrhea- secondary. Pt cannot take NSAIDs. Reviewed tx options. No contraindications to OCPs.   Will be begin Sprintec 1 po q day  F/u in 3 months or sooner prn       I discussed the assessment and treatment plan with the patient. The patient was provided an opportunity to ask questions and all were answered. The patient agreed with the plan and demonstrated an understanding of the instructions.   The patient was advised to call back or seek an in-person evaluation/go to the ED if the symptoms worsen or if the condition fails to improve as anticipated.  A spanish interpreter used for  the entire visit.   I provided 19 minutes of non-face-to-face time during this encounter.   Lavonia Drafts, MD [facility] [employer], [place]

## 2019-05-12 ENCOUNTER — Other Ambulatory Visit: Payer: Self-pay

## 2019-05-12 ENCOUNTER — Ambulatory Visit: Payer: No Typology Code available for payment source

## 2019-06-09 ENCOUNTER — Telehealth: Payer: Self-pay

## 2019-06-09 NOTE — Telephone Encounter (Signed)
Health Coaching 3  interpreter- Carla Fletcher, [facility]    Goals- Patient has increased the amount of fruits that she eats to 2 cups per day. Patient has been watching and decreasing the amount of sweets and sugars that she consumes.    New goal- Encouraged patient to try and increase the amount of vegetables that she eats everyday. Patient stated that she is currently eats 2 vegetables per day. I explained to her that the recommendation is for 3 vegetables per day. Patient has been eating salmon once a week. I also encouraged her to try and get 2 servings of heart healthy fish each week. Patient has currently been walking for 40 minutes, 2-3 times per week. New goal for exercise will be to try and walk 5 days a week for 40 minutes.  Barrier to reaching goal-  None. Patient was very open to suggestions.   Strategies to overcome- NA   Navigation:  Patient is aware of  a follow up session. Patient is scheduled for follow-up appointment on Monday, October 5th @ 10:00 am.   Time- 23 minutes

## 2019-07-14 ENCOUNTER — Telehealth: Payer: Self-pay | Admitting: Obstetrics & Gynecology

## 2019-07-14 NOTE — Telephone Encounter (Signed)
Called the patient to inform of upcoming appointment. Left a voicemail with in office interpreter Francine Graven advising of virtual appointment 10/23 @9 :15. If you have any question or concerns please contact our office at [phone].

## 2019-07-21 ENCOUNTER — Other Ambulatory Visit: Payer: Self-pay

## 2019-07-21 ENCOUNTER — Inpatient Hospital Stay: Payer: No Typology Code available for payment source | Attending: Obstetrics and Gynecology | Admitting: *Deleted

## 2019-07-21 VITALS — BP 126/70 | Temp 96.9°F | Ht 63.0 in | Wt 132.0 lb

## 2019-07-21 DIAGNOSIS — Z0000 Encounter for general adult medical examination without abnormal findings: Secondary | ICD-10-CM

## 2019-07-21 NOTE — Progress Notes (Signed)
[facility] follow up  Interpreter: [facility], [employer]  Clinical Measurement:  Height: 63 in Weight: 132 lb  Blood Pressure: 131/67  Blood Pressure #2: 126/70    Medical History:  Patient states that she does have high cholesterol. Patient states that she does not have a history of high blood pressure and diabetes.   Medications:  Patient states that she does not take medication to lower cholesterol, blood pressure or blood sugar. Patient does not take an aspirin a day to help prevent a heart attack or stroke.   Blood pressure, self measurement:  Patient states that she does not measure blood pressure from home and has not been told to do so by a healthcare provider.    Nutrition:  Patient states that on average she eats 2 cups of fruit and 2-3cups of vegetables per day. Patient states that she does not eat fish at least 2 times per week. In a typical day patient eats about half servings of whole grains. Patient drinks less than 36 ounces of beverages with added sugar weekly. Patient is not currently watching sodium or salt intake. In the past 7 days patient has not had any drinks containing alcohol. On average patient does not drink any drinks containing alcohol.       Physical activity:  Patient states that she gets 20 minutes of moderate and 0 minutes of vigorous physical activity each week.  Smoking status:  Patient states that she has never smoked tobacco.   Quality of life:  Over the past 2 weeks patient states that she has days where she has little interest or pleasure in doing things and days where she has felt down, depressed or hopeless.  Health Coaching: Encouraged patient to keep up with healthy diet changes she has made over the past several months. I informed the patient that she has lost 9 pounds since her initial visit in July. Encouraged patient to continue with fruit and vegetable intake as well as whole grains. Encouraged her to try and eat heart healthy  fish like salmon and tuna at least twice a week. Also spoke with her about continuing to reduce the amount of fried and fatty foods, high fat dairy and red meat that she consumes. Also spoke about watching the amount of salt that she cooks and eats. Encouraged patient to try and start walking for 20 minutes a day.    Navigation: This was the  follow up session for this patient, I will check up on her progress in the coming months.  Time: 20 minutes

## 2019-08-08 ENCOUNTER — Ambulatory Visit: Payer: No Typology Code available for payment source | Admitting: Obstetrics & Gynecology

## 2019-08-15 ENCOUNTER — Encounter

## 2019-10-30 ENCOUNTER — Ambulatory Visit: Payer: Self-pay | Attending: Internal Medicine

## 2019-10-30 DIAGNOSIS — Z20822 Contact with and (suspected) exposure to covid-19: Secondary | ICD-10-CM

## 2019-11-01 LAB — NOVEL CORONAVIRUS, NAA: SARS-CoV-2, NAA: NOT DETECTED

## 2019-11-04 ENCOUNTER — Telehealth: Payer: Self-pay | Admitting: General Practice

## 2019-11-04 NOTE — Telephone Encounter (Signed)
Negative COVID results given. Patient results "NOT Detected." Caller expressed understanding. ° °

## 2020-04-07 ENCOUNTER — Other Ambulatory Visit: Payer: Self-pay

## 2020-04-07 ENCOUNTER — Telehealth: Payer: Self-pay

## 2020-04-07 DIAGNOSIS — Z1231 Encounter for screening mammogram for malignant neoplasm of breast: Secondary | ICD-10-CM

## 2020-04-07 NOTE — Telephone Encounter (Signed)
Entered in error

## 2020-04-28 ENCOUNTER — Ambulatory Visit: Payer: Self-pay

## 2020-05-04 ENCOUNTER — Ambulatory Visit: Payer: Self-pay

## 2020-05-17 ENCOUNTER — Encounter: Payer: Self-pay | Admitting: Internal Medicine

## 2020-05-17 NOTE — Progress Notes (Deleted)
   CC: ***  HPI:  [name] is a 44 y.o.   No past medical history on file.   PMHx: Prediabetes Hypercholesteremia  Medications history: Vitamin D3   Review of Systems:  ***  Physical Exam:  There were no vitals filed for this visit. *** Constitutional: ***Well-developed and well-nourished. No acute distress.  HENT:  Head: Normocephalic and atraumatic.  Eyes: ***Conjunctivae are normal, EOM nl Cardiovascular: *** RRR, nl S1S2, ***no murmur, *** no LEE Respiratory: ***Effort normal and breath sounds normal. No respiratory distress. No wheezes.  GI: ***Soft. Bowel sounds are normal. No distension. There is no tenderness.  Neurological: Is alert and oriented x 3 *** Skin: Not diaphoretic. No erythema.  Psychiatric: *** Normal mood and affect. Behavior is normal. Judgment and thought content normal.    Assessment & Plan:   See Encounters Tab for problem based charting.  Patient discussed with Dr. {NAMES:[id number]::"Butcher","Guilloud","Hoffman","Mullen","Narendra","Raines","Vincent"}   Patient was seen in clinic a year ago for lab result review: 240, LDL 149 with HDL of 55.  Her ASCVD score showed 1.1% risk. Decided to manage with lifestyle modification. She endorses doing exercise and has a low fat diet. ***  -Repeating lipid panel today -Continue low fat diet and exercise

## 2020-05-20 ENCOUNTER — Other Ambulatory Visit: Payer: Self-pay

## 2020-05-20 ENCOUNTER — Ambulatory Visit: Payer: Self-pay | Admitting: *Deleted

## 2020-05-20 ENCOUNTER — Ambulatory Visit
Admission: RE | Admit: 2020-05-20 | Discharge: 2020-05-20 | Disposition: A | Discharge status: Home / Self Care | Payer: No Typology Code available for payment source | Source: Ambulatory Visit | Attending: Obstetrics and Gynecology | Admitting: Obstetrics and Gynecology

## 2020-05-20 VITALS — BP 122/80 | Temp 97.5°F | Wt 126.7 lb

## 2020-05-20 DIAGNOSIS — Z1231 Encounter for screening mammogram for malignant neoplasm of breast: Secondary | ICD-10-CM

## 2020-05-20 DIAGNOSIS — Z01419 Encounter for gynecological examination (general) (routine) without abnormal findings: Secondary | ICD-10-CM

## 2020-05-20 NOTE — Patient Instructions (Addendum)
Explained breast self awareness with Carla Fletcher. Let patient know [facility] will cover Pap smears and HPV typing every 5 years unless has a history of abnormal Pap smears. Referred patient to the [facility] for a screening mammogram on the mobile unit. Appointment scheduled Thursday, May 20, 2020 at 1100. Patient escorted to mobile unit following [facility]CP appointment. Let patient know will follow up with her within the next couple weeks with results of Pap smear and wet prep by phone. Informed patient that the [facility] will follow-up with her within the next couple of weeks with results of her mammogram by letter or phone. Carla Fletcher verbalized understanding.  Kuron Docken, Arvil Chaco, RN 10:38 AM

## 2020-05-20 NOTE — Progress Notes (Signed)
[name] is a 44 y.o. G0P0000 female who presents to [facility] clinic today with no complaints.  Pap Smear: Pap smear completed today. Last Pap smear was in March 2018 at the [facility] and normal per patient. Per patient has no history of an abnormal Pap smear. Last Pap smear result is not available in Epic.   Physical exam: Breasts Breasts symmetrical. No skin abnormalities bilateral breasts. No nipple retraction bilateral breasts. No nipple discharge bilateral breasts. No lymphadenopathy. No lumps palpated left breast. 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 No complaints of pain or tenderness on exam.      Pelvic/Bimanual Ext Genitalia No lesions, no swelling and no discharge observed on external genitalia.        Vagina Vagina pink and normal texture. No lesions and thick white discharge observed in vagina. Wet prep completed.        Cervix Cervix is present. Cervix pink and of normal texture. Cervix friable. Thick white discharge observed on cervix.   Uterus Uterus is present and palpable. Uterus in normal position and normal size.        Adnexae Bilateral ovaries present and palpable. No tenderness on palpation.         Rectovaginal No rectal exam completed today since patient had no rectal complaints. No skin abnormalities observed on exam.     Smoking History: Patient has never smoked.   Patient Navigation: Patient education provided. Access to services provided for patient through [facility] program. Spanish interpreter [name] from [facility] provided.    Breast and Cervical Cancer Risk Assessment: Patient does not have family history of breast cancer, known genetic mutations, or radiation treatment to the chest before age 58. Patient does not have history of cervical dysplasia, immunocompromised, or DES exposure  in-utero.  Risk Assessment    Risk Scores      05/20/2020 04/03/2019   Last edited by: Demetrius Revel, LPN [name], LPN   5-year risk: 1.4 % 1.3 %   Lifetime risk: 12.1 % 12.3 %          A: [facility]CP exam with pap smear No complaints.  P: Referred patient to the [facility] for a screening mammogram on the mobile unit. Appointment scheduled Thursday, May 20, 2020 at 1100.  Loletta Parish, RN 05/20/2020 10:38 AM

## 2020-05-21 LAB — CERVICOVAGINAL ANCILLARY ONLY
Bacterial Vaginitis (gardnerella): NEGATIVE
Candida Glabrata: NEGATIVE
Candida Vaginitis: NEGATIVE
Comment: NEGATIVE
Comment: NEGATIVE
Comment: NEGATIVE
Comment: NEGATIVE
Trichomonas: NEGATIVE

## 2020-05-24 ENCOUNTER — Telehealth: Payer: Self-pay

## 2020-05-24 LAB — CYTOLOGY - PAP
Comment: NEGATIVE
Diagnosis: NEGATIVE
High risk HPV: NEGATIVE

## 2020-05-24 NOTE — Telephone Encounter (Signed)
Via Dia Sitter, Spanish Interpreter, Patient informed negative Pap/HPV and wet prep results, next pap due in 5 years. Patient verbalized understanding.

## 2020-12-08 ENCOUNTER — Emergency Department (HOSPITAL_COMMUNITY): Payer: No Typology Code available for payment source

## 2020-12-08 ENCOUNTER — Encounter (HOSPITAL_COMMUNITY): Payer: Self-pay | Admitting: Emergency Medicine

## 2020-12-08 ENCOUNTER — Other Ambulatory Visit: Payer: Self-pay

## 2020-12-08 ENCOUNTER — Emergency Department (HOSPITAL_COMMUNITY)
Admission: EM | Admit: 2020-12-08 | Discharge: 2020-12-08 | Disposition: A | Discharge status: Home / Self Care | Payer: No Typology Code available for payment source | Attending: Emergency Medicine | Admitting: Emergency Medicine

## 2020-12-08 DIAGNOSIS — R1031 Right lower quadrant pain: Secondary | ICD-10-CM | POA: Insufficient documentation

## 2020-12-08 DIAGNOSIS — D649 Anemia, unspecified: Secondary | ICD-10-CM | POA: Insufficient documentation

## 2020-12-08 DIAGNOSIS — D259 Leiomyoma of uterus, unspecified: Secondary | ICD-10-CM | POA: Insufficient documentation

## 2020-12-08 LAB — CBC
HCT: 27.1 % — ABNORMAL LOW (ref 36.0–46.0)
Hemoglobin: 6.8 g/dL — CL (ref 12.0–15.0)
MCH: 15.2 pg — ABNORMAL LOW (ref 26.0–34.0)
MCHC: 25.1 g/dL — ABNORMAL LOW (ref 30.0–36.0)
MCV: 60.6 fL — ABNORMAL LOW (ref 80.0–100.0)
Platelets: 558 10*3/uL — ABNORMAL HIGH (ref 150–400)
RBC: 4.47 MIL/uL (ref 3.87–5.11)
RDW: 21.7 % — ABNORMAL HIGH (ref 11.5–15.5)
WBC: 6.8 10*3/uL (ref 4.0–10.5)
nRBC: 0 % (ref 0.0–0.2)

## 2020-12-08 LAB — COMPREHENSIVE METABOLIC PANEL
ALT: 21 U/L (ref 0–44)
AST: 31 U/L (ref 15–41)
Albumin: 3.8 g/dL (ref 3.5–5.0)
Alkaline Phosphatase: 73 U/L (ref 38–126)
Anion gap: 9 (ref 5–15)
BUN: 13 mg/dL (ref 6–20)
CO2: 23 mmol/L (ref 22–32)
Calcium: 8.9 mg/dL (ref 8.9–10.3)
Chloride: 105 mmol/L (ref 98–111)
Creatinine, Ser: 0.5 mg/dL (ref 0.44–1.00)
GFR, Estimated: >60 mL/min (ref >60)
Glucose, Bld: 103 mg/dL — ABNORMAL HIGH (ref 70–99)
Potassium: 3.5 mmol/L (ref 3.5–5.1)
Sodium: 137 mmol/L (ref 135–145)
Total Bilirubin: 0.4 mg/dL (ref 0.3–1.2)
Total Protein: 6.9 g/dL (ref 6.5–8.1)

## 2020-12-08 LAB — VITAMIN B12: Vitamin B-12: 424 pg/mL (ref 180–914)

## 2020-12-08 LAB — DIFFERENTIAL
Abs Immature Granulocytes: 0.02 10*3/uL (ref 0.00–0.07)
Basophils Absolute: 0.1 10*3/uL (ref 0.0–0.1)
Basophils Relative: 1 %
Eosinophils Absolute: 0.4 10*3/uL (ref 0.0–0.5)
Eosinophils Relative: 6 %
Immature Granulocytes: 0 %
Lymphocytes Relative: 34 %
Lymphs Abs: 2.4 10*3/uL (ref 0.7–4.0)
Monocytes Absolute: 0.4 10*3/uL (ref 0.1–1.0)
Monocytes Relative: 6 %
Neutro Abs: 3.7 10*3/uL (ref 1.7–7.7)
Neutrophils Relative %: 53 %
Smear Review: NORMAL

## 2020-12-08 LAB — RETICULOCYTES
Immature Retic Fract: 14.6 % (ref 2.3–15.9)
RBC.: 4.46 MIL/uL (ref 3.87–5.11)
Retic Count, Absolute: 50.8 10*3/uL (ref 19.0–186.0)
Retic Ct Pct: 1.1 % (ref 0.4–3.1)

## 2020-12-08 LAB — IRON AND TIBC
Iron: 10 ug/dL — ABNORMAL LOW (ref 28–170)
Saturation Ratios: 2 % — ABNORMAL LOW (ref 10.4–31.8)
TIBC: 458 ug/dL — ABNORMAL HIGH (ref 250–450)
UIBC: 448 ug/dL

## 2020-12-08 LAB — FERRITIN: Ferritin: 2 ng/mL — ABNORMAL LOW (ref 11–307)

## 2020-12-08 LAB — ABO/RH: ABO/RH(D): O POS

## 2020-12-08 LAB — FOLATE: Folate: 28 ng/mL (ref >5.9)

## 2020-12-08 LAB — POC OCCULT BLOOD, ED: Fecal Occult Bld: NEGATIVE

## 2020-12-08 LAB — PREPARE RBC (CROSSMATCH)

## 2020-12-08 MED ORDER — SODIUM CHLORIDE 0.9 % IV SOLN
10.0000 mL/h | Freq: Once | INTRAVENOUS | Status: AC
  Administered 2020-12-08: 10 mL/h via INTRAVENOUS

## 2020-12-08 MED ORDER — IOHEXOL 300 MG/ML  SOLN
100.0000 mL | Freq: Once | INTRAMUSCULAR | Status: AC | PRN
  Administered 2020-12-08: 100 mL via INTRAVENOUS

## 2020-12-08 MED ORDER — FERROUS SULFATE 325 (65 FE) MG PO TABS: 325.0000 mg | ORAL_TABLET | Freq: Every day | ORAL | 0 refills | Status: DC

## 2020-12-08 NOTE — Discharge Instructions (Addendum)
Your iron level is very low today. A prescription for iron vitamin has been sent to your pharmacy, take this every day. Follow up with your doctor for recheck and further work up. Iron can cause constipation, take a stool softener or laxitive as needed. Increase iron in your diet with green leafy vegetables (spinach, broccoli).   Su nivel de hierro es muy bajo hoy. Se ha enviado una receta de vitamina de hierro a Engineer, production, [employer]. Haga un seguimiento con su mdico para volver a Chief Technology Officer y Solicitor. El hierro puede causar estreimiento, tome un ablandador de heces o un laxante segn sea necesario. Aumente el hierro en su dieta con verduras de hojas verdes (espinacas, brcoli).

## 2020-12-08 NOTE — ED Notes (Signed)
Informed Consent for blood transfusion obtained and witnessed by this RN, paper form at bedside.

## 2020-12-08 NOTE — ED Triage Notes (Signed)
Patient sent to ED for evaluation of anemia. Patient reports that she has felt weak for six months. Denies any known source of bleeding but reports history of gastric ulcer in 2019. Patient alert, oriented, and in no apparent distress at this time.

## 2020-12-08 NOTE — ED Notes (Signed)
Discharge instructions gone over with patient and family at bedside with use of [employer] interpreter [phone]. Pt able to understand instructions, no other questions asked. Mychart explained to pt.

## 2020-12-08 NOTE — ED Provider Notes (Signed)
[facility] EMERGENCY DEPARTMENT Provider Note   CSN: [id number] Arrival date & time: 12/08/20  1131     History Chief Complaint  Patient presents with  . Anemia    Carla Fletcher is a 45 y.o. female.  45 year old female with complaint of feeling weak and fatigued for several weeks. Went to PCP yesterday, called and told her hgb was 6.8 and told to go to the ER. No known blood loss, LMP 11/25/20, lasted about 6 days, more heavy than usual. Denies blood in stools. Reports mild RLQ pain but states this is somewhat baseline for her when she has her menstrual cycles. Reports feeling lightheaded and dizzy. Eats a vegan diet. Does not use contraception but does not think she is pregnant. Has never had a transfusion, is agreeable to receiving blood. Has never had a colonoscopy or endoscopy, but reports history of peptic ulcer disease, saw GI 3-4 years ago, unsure who she saw.   A language interpreter was used (spanish).       History reviewed. No pertinent past medical history.  Patient Active Problem List   Diagnosis Date Noted  . Hypercholesteremia 04/25/2019  . Prediabetes 04/25/2019  . Cramp in limb 04/25/2019  . Screening breast examination 04/03/2019  . Left lower quadrant pain 06/29/2017  . Bloating 06/29/2017  . Nausea without vomiting 06/29/2017    Past Surgical History:  Procedure Laterality Date  . BREAST EXCISIONAL BIOPSY Left 2018   benign  . BREAST LUMPECTOMY WITH RADIOACTIVE SEED LOCALIZATION Left 03/26/2017   Procedure: LEFT BREAST LUMPECTOMY X2 WITH RADIOACTIVE SEED LOCALIZATION;  Surgeon: Jovita Kussmaul, MD;  Location: [facility];  Service: General;  Laterality: Left;  . BREAST SURGERY       OB History    Gravida  0   Para  0   Term  0   Preterm  0   AB  0   Living  0     SAB  0   IAB  0   Ectopic  0   Multiple  0   Live Births  0           Family History  Problem Relation Age of Onset  . Hypertension Mother    . Recurrent abdominal pain Mother   . Healthy Father   . Hypertension Maternal Grandmother        died age 45  . Hypotension Paternal Grandfather   . Colon cancer Neg Hx   . Rectal cancer Neg Hx   . Esophageal cancer Neg Hx   . Liver cancer Neg Hx   . Stomach cancer Neg Hx     Social History   Tobacco Use  . Smoking status: Never Smoker  . Smokeless tobacco: Never Used  Vaping Use  . Vaping Use: Never used  Substance Use Topics  . Alcohol use: No  . Drug use: No    Home Medications Prior to Admission medications   Medication Sig Start Date End Date Taking? Authorizing Provider  ferrous sulfate 325 (65 FE) MG tablet Take 1 tablet (325 mg total) by mouth daily. 12/08/20  Yes Tacy Learn, PA-C  Thiamine HCl (VITAMIN B-1 PO) Take 1 tablet by mouth daily.   Yes [provider]    Allergies    No known allergies  Review of Systems   Review of Systems  Constitutional: Negative for chills and fever.  Respiratory: Negative for shortness of breath.   Cardiovascular: Negative for chest pain.  Gastrointestinal: Positive for abdominal pain. Negative for blood in stool, constipation, diarrhea, nausea and vomiting.  Genitourinary: Negative for menstrual problem.  Musculoskeletal: Negative for arthralgias and myalgias.  Skin: Negative for wound.  Allergic/Immunologic: Negative for immunocompromised state.  Neurological: Positive for dizziness and light-headedness. Negative for weakness.  Hematological: Does not bruise/bleed easily.  All other systems reviewed and are negative.   Physical Exam Updated Vital Signs BP 106/67   Pulse 85   Temp 98.5 F (36.9 C) (Oral)   Resp 15   SpO2 100%   Physical Exam Vitals and nursing note reviewed. Exam conducted with a chaperone present.  Constitutional:      General: She is not in acute distress.    Appearance: She is well-developed and well-nourished. She is not diaphoretic.  HENT:     Head: Normocephalic and  atraumatic.  Eyes:     Conjunctiva/sclera: Conjunctivae normal.  Cardiovascular:     Rate and Rhythm: Normal rate and regular rhythm.     Pulses: Normal pulses.     Heart sounds: Normal heart sounds.  Pulmonary:     Effort: Pulmonary effort is normal.     Breath sounds: Normal breath sounds.  Abdominal:     Palpations: Abdomen is soft.     Tenderness: There is abdominal tenderness.  Genitourinary:    Rectum: No mass or tenderness.     Comments: Stool soft and brown Musculoskeletal:     Right lower leg: No edema.     Left lower leg: No edema.  Skin:    General: Skin is warm and dry.     Findings: No erythema or rash.  Neurological:     Mental Status: She is alert and oriented to person, place, and time.  Psychiatric:        Mood and Affect: Mood and affect normal.        Behavior: Behavior normal.     ED Results / Procedures / Treatments   Labs (all labs ordered are listed, but only abnormal results are displayed) Labs Reviewed  COMPREHENSIVE METABOLIC PANEL - Abnormal; Notable for the following components:      Result Value   Glucose, Bld 103 (*)    All other components within normal limits  CBC - Abnormal; Notable for the following components:   Hemoglobin 6.8 (*)    HCT 27.1 (*)    MCV 60.6 (*)    MCH 15.2 (*)    MCHC 25.1 (*)    RDW 21.7 (*)    Platelets 558 (*)    All other components within normal limits  IRON AND TIBC - Abnormal; Notable for the following components:   Iron 10 (*)    TIBC 458 (*)    Saturation Ratios 2 (*)    All other components within normal limits  FERRITIN - Abnormal; Notable for the following components:   Ferritin 2 (*)    All other components within normal limits  VITAMIN B12  RETICULOCYTES  FOLATE  DIFFERENTIAL  POC OCCULT BLOOD, ED  I-STAT BETA HCG BLOOD, ED ([facility], [facility], [facility] ONLY)  TYPE AND SCREEN  ABO/RH  PREPARE RBC (CROSSMATCH)    EKG None  Radiology CT Abdomen Pelvis W Contrast  Result Date: 12/08/2020 CLINICAL  DATA:  Anemia, weakness for 6 months EXAM: CT ABDOMEN AND PELVIS WITH CONTRAST TECHNIQUE: Multidetector CT imaging of the abdomen and pelvis was performed using the standard protocol following bolus administration of intravenous contrast. CONTRAST:  137mL OMNIPAQUE IOHEXOL 300 MG/ML  SOLN  COMPARISON:  07/02/2017 FINDINGS: Lower chest: No acute pleural or parenchymal lung disease. Hepatobiliary: No focal liver abnormality is seen. No gallstones, gallbladder wall thickening, or biliary dilatation. Pancreas: Unremarkable. No pancreatic ductal dilatation or surrounding inflammatory changes. Spleen: Normal in size without focal abnormality. Adrenals/Urinary Tract: Adrenal glands are unremarkable. Kidneys are normal, without renal calculi, focal lesion, or hydronephrosis. Bladder is unremarkable. Stomach/Bowel: No bowel obstruction or ileus. Normal appendix right lower quadrant. No bowel wall thickening or inflammatory change. Vascular/Lymphatic: No significant vascular findings are present. No enlarged abdominal or pelvic lymph nodes. Reproductive: 1.9 cm structure along the left lateral margin of the submucosal region of the uterus consistent with submucosal fibroid. Adnexal structures are unremarkable. Other: No free fluid or free gas.  No abdominal wall hernia. Musculoskeletal: No acute or destructive bony lesions. Reconstructed images demonstrate no additional findings. IMPRESSION: 1. No acute intra-abdominal or intrapelvic process. 2. Likely 1.9 cm submucosal uterine fibroid, grossly unchanged since 2018. Electronically Signed   By: Randa Ngo M.D.   On: 12/08/2020 16:52    Procedures Procedures   Medications Ordered in ED Medications  0.9 %  sodium chloride infusion (has no administration in time range)  iohexol (OMNIPAQUE) 300 MG/ML solution 100 mL (100 mLs Intravenous Contrast Given 12/08/20 1621)    ED Course  I have reviewed the triage vital signs and the nursing notes.  Pertinent labs &  imaging results that were available during my care of the patient were reviewed by me and considered in my medical decision making (see chart for details).  Clinical Course as of 12/08/20 1843  Wed Dec 09, 3551  [id number] 45 year old female, sent by PCP office for symptomatic anemia. Hgb 6.8 (hgb 12.2 3 years ago last on file). Reports having longer heavier menstrual cycles for the past few months, also lower abdominal pain which typically only occurs with her menstrual cycle and has persisted beyond end of her last cycle.  On exam, patient is well appearing, vitals WNL. Mild lower abdominal tenderness. Stool soft and brown, hemoccult pending.  Labs with hgb 6.8, microcytic anemia, anemia panel added for OP follow up. CMP WNL. Will CT abdomen/pelvis for abdominal pain with anemia. Hcg pending, hemoccult pending. Plan is to transfuse 1 unit. If work up is otherwise unrevealing, will plan for outpatient workup.  [LM]  2122 Labs show iron deficiency, will start of iron supplement. CT shows fibroid, unchanged from prior imaging.  Recommend recheck with PCP.  [LM]    Clinical Course User Index [LM] Roque Lias   MDM Rules/Calculators/A&P                          Final Clinical Impression(s) / ED Diagnoses Final diagnoses:  Symptomatic anemia  Uterine leiomyoma, unspecified location    Rx / DC Orders ED Discharge Orders         Ordered    ferrous sulfate 325 (65 FE) MG tablet  Daily        12/08/20 1833           Tacy Learn, PA-C 12/08/20 1843    Lacretia Leigh, MD 12/13/20 [phone]

## 2020-12-08 NOTE — ED Notes (Signed)
Family updated as to patient's status.

## 2020-12-09 LAB — BPAM RBC
Blood Product Expiration Date: 202203232359
ISSUE DATE / TIME: 202202231812
Unit Type and Rh: 5100

## 2020-12-09 LAB — TYPE AND SCREEN
ABO/RH(D): O POS
Antibody Screen: NEGATIVE
Unit division: 0

## 2020-12-30 ENCOUNTER — Encounter: Payer: Self-pay | Admitting: Gastroenterology

## 2020-12-30 ENCOUNTER — Ambulatory Visit (INDEPENDENT_AMBULATORY_CARE_PROVIDER_SITE_OTHER): Payer: Self-pay | Admitting: Gastroenterology

## 2020-12-30 ENCOUNTER — Other Ambulatory Visit (INDEPENDENT_AMBULATORY_CARE_PROVIDER_SITE_OTHER): Payer: No Typology Code available for payment source

## 2020-12-30 VITALS — BP 110/70 | HR 90 | Ht 62.5 in | Wt 121.0 lb

## 2020-12-30 DIAGNOSIS — D509 Iron deficiency anemia, unspecified: Secondary | ICD-10-CM | POA: Insufficient documentation

## 2020-12-30 LAB — CBC
HCT: 36.3 % (ref 36.0–46.0)
Hemoglobin: 11.8 g/dL — ABNORMAL LOW (ref 12.0–15.0)
MCHC: 32.4 g/dL (ref 30.0–36.0)
MCV: 68.9 fl — ABNORMAL LOW (ref 78.0–100.0)
Platelets: 354 10*3/uL (ref 150.0–400.0)
RBC: 5.27 Mil/uL — ABNORMAL HIGH (ref 3.87–5.11)
RDW: 40 % — ABNORMAL HIGH (ref 11.5–15.5)
WBC: 5.5 10*3/uL (ref 4.0–10.5)

## 2020-12-30 LAB — FERRITIN: Ferritin: 134.4 ng/mL (ref 10.0–291.0)

## 2020-12-30 LAB — IBC PANEL
Iron: 78 ug/dL (ref 42–145)
Saturation Ratios: 21.3 % (ref 20.0–50.0)
Transferrin: 261 mg/dL (ref 212.0–360.0)

## 2020-12-30 NOTE — Patient Instructions (Addendum)
If you are age 45 or older, your body mass index should be between 23-30. Your Body mass index is 21.78 kg/m. If this is out of the aforementioned range listed, please consider follow up with your Primary Care Provider.  If you are age 5 or younger, your body mass index should be between 19-25. Your Body mass index is 21.78 kg/m. If this is out of the aformentioned range listed, please consider follow up with your Primary Care Provider.   You have been scheduled for a colonoscopy. Please follow written instructions given to you at your visit today.  Please pick up your prep supplies at the pharmacy within the next 1-3 days. If you use inhalers (even only as needed), please bring them with you on the day of your procedure.  Your provider has requested that you go to the basement level for lab work before leaving today. Press "B" on the elevator. The lab is located at the first door on the left as you exit the elevator.  Due to recent changes in healthcare laws, you may see the results of your imaging and laboratory studies on MyChart before your provider has had a chance to review them.  We understand that in some cases there may be results that are confusing or concerning to you. Not all laboratory results come back in the same time frame and the provider may be waiting for multiple results in order to interpret others.  Please give [place] 48 hours in order for your provider to thoroughly review all the results before contacting the office for clarification of your results.   Thank you for choosing me and [facility].  Alonza Bogus, PA-C

## 2020-12-30 NOTE — Progress Notes (Signed)
12/30/2020 Carla Fletcher [id number] 1976-02-20   HISTORY OF PRESENT ILLNESS:  This is a 45 year old female who is new to our office.  She was referred here by Stana Bunting, PA-C, for evaluation of IDA.  Hgb was down to 6.8 grams and iron studies extremely low.  Has received 1 units PRBC and IV iron infusions and is on ferrous sulfate PO once daily as well.  She reports very heavy periods with clots that have been lasting 7 days for the past 8 months compared to only 2-3 days previously.  She denies any evidence of GI bleeding and hemoccult was negative.  Only other complaint is left lower quadrant abdominal pain that is worse during her periods.  CT scan of the abdomen and pelvis was unremarkable except for a stable uterine fibroid.  She was feeling very fatigued but is feeling much better now.  She moves her bowels regularly without issues, uses occasional Miralax prn.  No heartburn/reflux.    Past Medical History:  Diagnosis Date  . Iron deficiency anemia    Past Surgical History:  Procedure Laterality Date  . BREAST EXCISIONAL BIOPSY Left 2018   benign  . BREAST LUMPECTOMY WITH RADIOACTIVE SEED LOCALIZATION Left 03/26/2017   Procedure: LEFT BREAST LUMPECTOMY X2 WITH RADIOACTIVE SEED LOCALIZATION;  Surgeon: Jovita Kussmaul, MD;  Location: [place];  Service: General;  Laterality: Left;  . BREAST SURGERY      reports that she has never smoked. She has never used smokeless tobacco. She reports that she does not drink alcohol and does not use drugs. family history includes Healthy in her father; Hypertension in her maternal grandmother and mother; Hypotension in her paternal grandfather; Recurrent abdominal pain in her mother. Allergies  Allergen Reactions  . No Known Allergies       Outpatient Encounter Medications as of 12/30/2020  Medication Sig  . ferrous sulfate 325 (65 FE) MG tablet Take 1 tablet (325 mg total) by mouth daily.  . polyethylene glycol (MIRALAX /  GLYCOLAX) 17 g packet Take 17 g by mouth daily as needed.  . Thiamine HCl (VITAMIN B-1 PO) Take 1 tablet by mouth daily.   No facility-administered encounter medications on file as of 12/30/2020.     REVIEW OF SYSTEMS  : All other systems reviewed and negative except where noted in the History of Present Illness.   PHYSICAL EXAM: BP 110/70   Pulse 90   Ht 5' 2.5" (1.588 m)   Wt 121 lb (54.9 kg)   BMI 21.78 kg/m  General: Well developed Hispanic female in no acute distress Head: Normocephalic and atraumatic Eyes:  Sclerae anicteric, conjunctiva pink. Ears: Normal auditory acuity Lungs: Clear throughout to auscultation; no W/R/R. Heart: Regular rate and rhythm; no M/R/G. Abdomen: Soft, non-distended.  BS present.  Mild lower abdominal TTP. Rectal:  Will be done at the time of colonoscopy. Musculoskeletal: Symmetrical with no gross deformities  Skin: No lesions on visible extremities Extremities: No edema  Neurological: Alert oriented x 4, grossly non-focal Psychological:  Alert and cooperative. Normal mood and affect  ASSESSMENT AND PLAN: *Profound symptomatic IDA:  Hgb was down to 6.8 grams and iron studies extremely low.  Has received 1 units PRBC and IV iron infusions and is on ferrous sulfate PO once daily as well.  She reports very heavy periods with clots that have been lasting 7 days for the past 8 months compared to only 2-3 days previously.  She denies any evidence of GI  bleeding and hemoccult was negative.  Only other complaint is left lower quadrant abdominal pain that is worse during her periods.  CT scan of the abdomen and pelvis was unremarkable except for a stable uterine fibroid.  I feel strongly that her anemia is due to her GYN losses.  That being said, she will be 45 soon so we will proceed with colonoscopy to rule out lower GI bleeding and also to serve as screening purposes.  Scheduled with Dr. Silverio Decamp  The risks, benefits, and alternatives to colonoscopy were  discussed with the patient and she consents to proceed.  Will recheck CBC and iron studies today.   CC:  Masoudi, Camera operator, *

## 2020-12-31 ENCOUNTER — Telehealth: Payer: Self-pay | Admitting: Gastroenterology

## 2020-12-31 ENCOUNTER — Other Ambulatory Visit: Payer: Self-pay

## 2020-12-31 ENCOUNTER — Encounter: Payer: Self-pay | Admitting: Obstetrics & Gynecology

## 2020-12-31 ENCOUNTER — Ambulatory Visit (INDEPENDENT_AMBULATORY_CARE_PROVIDER_SITE_OTHER): Payer: Self-pay | Admitting: Obstetrics & Gynecology

## 2020-12-31 VITALS — BP 130/76 | Ht 62.5 in | Wt 121.0 lb

## 2020-12-31 DIAGNOSIS — N920 Excessive and frequent menstruation with regular cycle: Secondary | ICD-10-CM

## 2020-12-31 MED ORDER — NORETHINDRONE 0.35 MG PO TABS: 1.0000 | ORAL_TABLET | Freq: Every day | ORAL | 11 refills | Status: DC

## 2020-12-31 NOTE — Progress Notes (Signed)
    Carla Fletcher [birth year] [id number]        45 y.o.  G0 Long distance boyfriend  RP: Menorrhagia with a probable SM Fibroid  HPI: Severe menorrhagia with secondary anemia for which patient had a blood transfusion on 12/08/2020.  A CT scan was done on 12/08/2020 showing a probable SM Fibroid measured at 1.9 cm.  Not on any hormonal treatment.  LMP 12/26/2020.  Only very mild spotting now.  No pelvic pain.  No currently sexually active.   OB History  Gravida Para Term Preterm AB Living  0 0 0 0 0 0  SAB IAB Ectopic Multiple Live Births  0 0 0 0 0    Past medical history,surgical history, problem list, medications, allergies, family history and social history were all reviewed and documented in the EPIC chart.   Directed ROS with pertinent positives and negatives documented in the history of present illness/assessment and plan.  Exam:  Vitals:   12/31/20 1015  BP: 130/76  Weight: 121 lb (54.9 kg)  Height: 5' 2.5" (1.588 m)   General appearance:  Normal  Abdomen: Normal  Gynecologic exam: Vulva normal.  Bimanual exam:  Uterus AV, normal volume, mobile, NT.  No adnexal mass, NT.  No blood, no discharge.  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 G0   1. Menorrhagia with regular cycle Severe menorrhagia with a 1.9 cm submucosal fibroid.  Secondary anemia which required blood transfusion in February 2022.  Current hemoglobin is much improved at 11.8 as of yesterday.  Decision to start on the progestin only pill to decrease the menstrual flow.  No contraindication.  Usage reviewed and prescription sent to pharmacy.  Patient will follow up with a pelvic ultrasound to further investigate and assess the uterus especially in the endometrial lining and fibroids.  Patient voiced understanding and agreement with the plan. - US Transvaginal Non-OB;  Future  Other orders - norethindrone (MICRONOR) 0.35 MG tablet; Take 1 tablet (0.35 mg total) by mouth daily.  Princess Bruins MD, 10:32 AM 12/31/2020

## 2020-12-31 NOTE — Telephone Encounter (Signed)
Informed pt of lab results given by Alonza Bogus

## 2021-01-04 ENCOUNTER — Other Ambulatory Visit: Payer: Self-pay

## 2021-01-04 MED ORDER — FERROUS SULFATE 325 (65 FE) MG PO TABS: 325.0000 mg | ORAL_TABLET | Freq: Every day | ORAL | 0 refills | Status: DC

## 2021-01-05 ENCOUNTER — Encounter: Payer: Self-pay | Admitting: Obstetrics & Gynecology

## 2021-01-05 ENCOUNTER — Telehealth: Payer: Self-pay

## 2021-01-05 ENCOUNTER — Ambulatory Visit (INDEPENDENT_AMBULATORY_CARE_PROVIDER_SITE_OTHER): Payer: Self-pay

## 2021-01-05 ENCOUNTER — Other Ambulatory Visit: Payer: Self-pay

## 2021-01-05 ENCOUNTER — Ambulatory Visit (INDEPENDENT_AMBULATORY_CARE_PROVIDER_SITE_OTHER): Payer: Self-pay | Admitting: Obstetrics & Gynecology

## 2021-01-05 VITALS — BP 124/76

## 2021-01-05 DIAGNOSIS — D250 Submucous leiomyoma of uterus: Secondary | ICD-10-CM

## 2021-01-05 DIAGNOSIS — N920 Excessive and frequent menstruation with regular cycle: Secondary | ICD-10-CM

## 2021-01-05 DIAGNOSIS — D509 Iron deficiency anemia, unspecified: Secondary | ICD-10-CM

## 2021-01-05 NOTE — Telephone Encounter (Signed)
Carla Bruins, MD  Ramond Craver, RMA  Menorrhagia  ICD10  N92.0 SM Myoma  D25.0 Secondary Anemia  D50.9  [facility] Myosure Excision/D+C [phone] Sonata procedure ?  [facility]   1 hour   F/U Preop   Private pay, needs to know the difference in price with and without Sonata.    Hayley, I will wait to start Sonata process until you discuss costs with her and you determine how she wants to proceed. Thanks

## 2021-01-05 NOTE — Progress Notes (Signed)
    Carla Fletcher [birth year] [id number]        45 y.o.  G0 Long distance boyfriend  RP: Menorrhagia with a probable SM Fibroid for Pelvic US  HPI: Tried the Progestin only pill, but stopped after 3 days because of H/As.  No current vaginal bleeding.  Severe menorrhagia with secondary anemia for which patient had a blood transfusion on 12/08/2020.  A CT scan was done on 12/08/2020 showing a probable SM Fibroid measured at 1.9 cm. LMP 12/26/2020.  No pelvic pain.  No currently sexually active.   OB History  Gravida Para Term Preterm AB Living  0 0 0 0 0 0  SAB IAB Ectopic Multiple Live Births  0 0 0 0 0    Past medical history,surgical history, problem list, medications, allergies, family history and social history were all reviewed and documented in the EPIC chart.   Directed ROS with pertinent positives and negatives documented in the history of present illness/assessment and plan.  Exam:  Vitals:   01/05/21 0822  BP: 124/76   General appearance:  Normal  Pelvic US today: T/V images.  Anteverted uterus with through intramural fibroids as well as 1 submucosal fibroid measured at 2.1 cm.  Thickened endometrial lining with a submucosal fibroid noted.  The endometrial lining is measured at 23.71 mm.  Both ovaries are normal in size with normal follicular pattern.  A dominant simple follicle is present on the left ovary.  No adnexal mass seen.  No free fluid in the posterior cul-de-sac.   Assessment/Plan:  45 y.o. G0P0000   1. Menorrhagia with regular cycle Menorrhagia with secondary anemia probably caused by a submucosal fibroid as well as 2 intramural fibroids.  The progestin pill was not tolerated.  Decision to proceed with hysteroscopy, excision of submucosal fibroid, D&C and Sonata procedure.  Information and pamphlet given.  Follow-up preop visit.  2. Submucous uterine fibroid Submucosal fibroma measured at 2.1 cm.  3. Iron deficiency anemia, unspecified iron  deficiency anemia type Last hemoglobin improved at 11.8.  Continue with iron supplements.  Princess Bruins MD, 9:02 AM 01/05/2021

## 2021-01-17 ENCOUNTER — Encounter: Payer: No Typology Code available for payment source | Admitting: Gastroenterology

## 2021-01-29 ENCOUNTER — Other Ambulatory Visit: Payer: Self-pay

## 2021-01-29 ENCOUNTER — Emergency Department (HOSPITAL_COMMUNITY)
Admission: EM | Admit: 2021-01-29 | Discharge: 2021-01-29 | Disposition: A | Discharge status: Home / Self Care | Payer: No Typology Code available for payment source | Attending: Emergency Medicine | Admitting: Emergency Medicine

## 2021-01-29 ENCOUNTER — Encounter (HOSPITAL_COMMUNITY): Payer: Self-pay | Admitting: Emergency Medicine

## 2021-01-29 DIAGNOSIS — N926 Irregular menstruation, unspecified: Secondary | ICD-10-CM

## 2021-01-29 DIAGNOSIS — D72829 Elevated white blood cell count, unspecified: Secondary | ICD-10-CM | POA: Insufficient documentation

## 2021-01-29 DIAGNOSIS — N946 Dysmenorrhea, unspecified: Secondary | ICD-10-CM | POA: Insufficient documentation

## 2021-01-29 DIAGNOSIS — N939 Abnormal uterine and vaginal bleeding, unspecified: Secondary | ICD-10-CM | POA: Insufficient documentation

## 2021-01-29 HISTORY — DX: Benign neoplasm of connective and other soft tissue, unspecified: D21.9

## 2021-01-29 LAB — COMPREHENSIVE METABOLIC PANEL
ALT: 21 U/L (ref 0–44)
AST: 24 U/L (ref 15–41)
Albumin: 3.8 g/dL (ref 3.5–5.0)
Alkaline Phosphatase: 54 U/L (ref 38–126)
Anion gap: 15 (ref 5–15)
BUN: 14 mg/dL (ref 6–20)
CO2: 19 mmol/L — ABNORMAL LOW (ref 22–32)
Calcium: 9.2 mg/dL (ref 8.9–10.3)
Chloride: 102 mmol/L (ref 98–111)
Creatinine, Ser: 0.62 mg/dL (ref 0.44–1.00)
GFR, Estimated: >60 mL/min (ref >60)
Glucose, Bld: 130 mg/dL — ABNORMAL HIGH (ref 70–99)
Potassium: 3.5 mmol/L (ref 3.5–5.1)
Sodium: 136 mmol/L (ref 135–145)
Total Bilirubin: 0.3 mg/dL (ref 0.3–1.2)
Total Protein: 6.7 g/dL (ref 6.5–8.1)

## 2021-01-29 LAB — URINALYSIS, ROUTINE W REFLEX MICROSCOPIC
Bacteria, UA: NONE SEEN
Bilirubin Urine: NEGATIVE
Glucose, UA: NEGATIVE mg/dL
Ketones, ur: 80 mg/dL — AB
Leukocytes,Ua: NEGATIVE
Nitrite: NEGATIVE
Protein, ur: 30 mg/dL — AB
RBC / HPF: >50 RBC/hpf — ABNORMAL HIGH (ref 0–5)
Specific Gravity, Urine: 1.028 (ref 1.005–1.030)
pH: 8 (ref 5.0–8.0)

## 2021-01-29 LAB — CBC
HCT: 40.6 % (ref 36.0–46.0)
Hemoglobin: 12.9 g/dL (ref 12.0–15.0)
MCH: 24.8 pg — ABNORMAL LOW (ref 26.0–34.0)
MCHC: 31.8 g/dL (ref 30.0–36.0)
MCV: 77.9 fL — ABNORMAL LOW (ref 80.0–100.0)
Platelets: 348 10*3/uL (ref 150–400)
RBC: 5.21 MIL/uL — ABNORMAL HIGH (ref 3.87–5.11)
WBC: 13.2 10*3/uL — ABNORMAL HIGH (ref 4.0–10.5)
nRBC: 0 % (ref 0.0–0.2)

## 2021-01-29 LAB — I-STAT BETA HCG BLOOD, ED (MC, WL, AP ONLY): I-stat hCG, quantitative: <5 m[IU]/mL (ref <5)

## 2021-01-29 LAB — LIPASE, BLOOD: Lipase: 29 U/L (ref 11–51)

## 2021-01-29 MED ORDER — IBUPROFEN 400 MG PO TABS
600.0000 mg | ORAL_TABLET | Freq: Once | ORAL | Status: AC
  Administered 2021-01-29: 600 mg via ORAL
  Filled 2021-01-29: qty 1

## 2021-01-29 MED ORDER — IBUPROFEN 600 MG PO TABS: 600.0000 mg | ORAL_TABLET | Freq: Four times a day (QID) | ORAL | 0 refills | Status: DC | PRN

## 2021-01-29 NOTE — ED Notes (Signed)
Trauma surgeon at bedside.  

## 2021-01-29 NOTE — Discharge Instructions (Signed)
Left before without discharge papers.

## 2021-01-29 NOTE — ED Provider Notes (Signed)
[facility] Provider Note   CSN: [id number] Arrival date & time: 01/29/21  1356     History Chief Complaint  Patient presents with  . Abdominal Pain    Carla Fletcher is a 45 y.o. female with a past medical history of fibroids, iron deficiency anemia, recently seen by OB/GYN for heavy menstrual cycles, iron deficiency anemia and transfusion last month, who presents today for evaluation of ongoing menstrual pain and bleeding. She states that her menstrual cycle started on Monday.  She notes they normally only last about 3 days.  She states that the bleeding is heavier than usual and that she is having more pain.  She took a total of 1 g of Tylenol today without significant relief. She denies any fevers.  No nausea, vomiting, or diarrhea.  No dysuria increased frequency or urgency. She has started taking an old birth control containing estrogen the same day of her cycle started.  She states that she was prescribed this over a year ago.  Her OB/GYN who she saw her recently does not know she is taking this.  She reports compliance with her iron supplementation.  She states that the pain feels like a usual menstrual cramp just is lasting longer and she is having more bleeding than usual.  She is not sexually active, denies any concern for STI or STD.  Patient states that she recently had exam with OB/GYN, and declines repeat exam today.  She states that in the past she has taken ibuprofen for menstrual cycles however due to her recently needing a blood transfusion she has been afraid to take anything other than Tylenol.  Interactions with patient performed using professional medical Spanish speaking video interpreters. HPI     Past Medical History:  Diagnosis Date  . Fibroids   . Iron deficiency anemia     Patient Active Problem List   Diagnosis Date Noted  . Iron deficiency anemia 12/30/2020  . Hypercholesteremia 04/25/2019  .  Prediabetes 04/25/2019  . Cramp in limb 04/25/2019  . Screening breast examination 04/03/2019  . Left lower quadrant pain 06/29/2017  . Bloating 06/29/2017  . Nausea without vomiting 06/29/2017    Past Surgical History:  Procedure Laterality Date  . BREAST EXCISIONAL BIOPSY Left 2018   benign  . BREAST LUMPECTOMY WITH RADIOACTIVE SEED LOCALIZATION Left 03/26/2017   Procedure: LEFT BREAST LUMPECTOMY X2 WITH RADIOACTIVE SEED LOCALIZATION;  Surgeon: Jovita Kussmaul, MD;  Location: [place];  Service: General;  Laterality: Left;  . BREAST SURGERY       OB History    Gravida  0   Para  0   Term  0   Preterm  0   AB  0   Living  0     SAB  0   IAB  0   Ectopic  0   Multiple  0   Live Births  0           Family History  Problem Relation Age of Onset  . Hypertension Mother   . Recurrent abdominal pain Mother   . Healthy Father   . Hypertension Maternal Grandmother        died age 33  . Hypotension Paternal Grandfather   . Colon cancer Neg Hx   . Rectal cancer Neg Hx   . Esophageal cancer Neg Hx   . Liver cancer Neg Hx   . Stomach cancer Neg Hx     Social History   Tobacco  Use  . Smoking status: Never Smoker  . Smokeless tobacco: Never Used  Vaping Use  . Vaping Use: Never used  Substance Use Topics  . Alcohol use: No  . Drug use: No    Home Medications Prior to Admission medications   Medication Sig Start Date End Date Taking? Authorizing Provider  ibuprofen (ADVIL) 600 MG tablet Take 1 tablet (600 mg total) by mouth every 6 (six) hours as needed. 01/29/21  Yes Lorin Glass, PA-C  ferrous sulfate 325 (65 FE) MG tablet Take 1 tablet (325 mg total) by mouth daily. 01/04/21   Zehr, Laban Emperor, PA-C  norethindrone (MICRONOR) 0.35 MG tablet Take 1 tablet (0.35 mg total) by mouth daily. 12/31/20   Princess Bruins, MD  polyethylene glycol (MIRALAX / GLYCOLAX) 17 g packet Take 17 g by mouth daily as needed.    [provider]  Thiamine HCl  (VITAMIN B-1 PO) Take 1 tablet by mouth daily.    [provider]    Allergies    Patient has no known allergies.  Review of Systems   Review of Systems  Constitutional: Negative for chills, fatigue and fever.  Respiratory: Negative for shortness of breath.   Cardiovascular: Negative for chest pain.  Gastrointestinal: Negative for abdominal pain, diarrhea, nausea and vomiting.  Genitourinary: Positive for menstrual problem, pelvic pain and vaginal pain. Negative for dysuria, frequency and urgency.  Musculoskeletal: Negative for back pain and neck pain.  Skin: Negative for color change and rash.  Neurological: Negative for weakness and headaches.  Psychiatric/Behavioral: Negative for confusion.  All other systems reviewed and are negative.   Physical Exam Updated Vital Signs BP 111/70   Pulse 93   Temp 98.6 F (37 C) (Oral)   Resp (!) 21   LMP 01/24/2021   SpO2 99%   Physical Exam Vitals and nursing note reviewed.  Constitutional:      General: She is not in acute distress.    Appearance: She is not diaphoretic.  HENT:     Head: Normocephalic and atraumatic.  Eyes:     General: No scleral icterus.       Right eye: No discharge.        Left eye: No discharge.     Conjunctiva/sclera: Conjunctivae normal.  Cardiovascular:     Rate and Rhythm: Normal rate and regular rhythm.  Pulmonary:     Effort: Pulmonary effort is normal. No respiratory distress.     Breath sounds: No stridor.  Abdominal:     General: Bowel sounds are normal. There is no distension.     Palpations: Abdomen is soft.     Tenderness: There is abdominal tenderness in the right lower quadrant, suprapubic area and left lower quadrant. There is no guarding or rebound.     Hernia: No hernia is present.  Genitourinary:    Comments: Patient declined, had exam at obgyn recently.  Musculoskeletal:        General: No deformity.     Cervical back: Normal range of motion.  Skin:    General: Skin is  warm and dry.  Neurological:     Mental Status: She is alert.     Motor: No abnormal muscle tone.  Psychiatric:        Behavior: Behavior normal.     ED Results / Procedures / Treatments   Labs (all labs ordered are listed, but only abnormal results are displayed) Labs Reviewed  COMPREHENSIVE METABOLIC PANEL - Abnormal; Notable for the following components:  Result Value   CO2 19 (*)    Glucose, Bld 130 (*)    All other components within normal limits  CBC - Abnormal; Notable for the following components:   WBC 13.2 (*)    RBC 5.21 (*)    MCV 77.9 (*)    MCH 24.8 (*)    All other components within normal limits  URINALYSIS, ROUTINE W REFLEX MICROSCOPIC - Abnormal; Notable for the following components:   Color, Urine AMBER (*)    APPearance HAZY (*)    Hgb urine dipstick MODERATE (*)    Ketones, ur 80 (*)    Protein, ur 30 (*)    RBC / HPF >50 (*)    All other components within normal limits  LIPASE, BLOOD  I-STAT BETA HCG BLOOD, ED ([facility], [facility], [facility] ONLY)    EKG None  Radiology No results found.  Procedures Procedures   Medications Ordered in ED Medications  ibuprofen (ADVIL) tablet 600 mg (600 mg Oral Given 01/29/21 1702)    ED Course  I have reviewed the triage vital signs and the nursing notes.  Pertinent labs & imaging results that were available during my care of the patient were reviewed by me and considered in my medical decision making (see chart for details).  Clinical Course as of 01/29/21 1959  Sat Jan 29, 2021  1853 Patient is reevaluated, her pain is now down to a 2 out of 10 after ibuprofen and a heating pack.  She states she is ready to go home. [EH]    Clinical Course User Index [EH] Ollen Gross   MDM Rules/Calculators/A&P                         Patient is a 45 year old Spanish-speaking woman who presents today for evaluation of pelvic pain.  She is currently on her menstrual cycle.  She will required a transfusion last  month for her anemia and states that since then she has been afraid to take ibuprofen which she has taken in the past to help with menstrual pain. Her pain is in the usual location for her with her menstrual cycle however she states that this 1 is more painful and heavier.  She notes she has been having heavy or painful periods over the past few months. Of note she did start taking a estrogen containing birth control on her own the same day her menstrual cycle started.  This was prescribed to her about a year ago per her report. She has already been seen by OB/GYN for this and had an ultrasound and it appears that they felt her heavy painful bleeding was due to fibroids and have recommended surgery for treatment.  Here patient is not anemic, her hemoglobin has improved significantly and is currently 12.9.  Her white count is slightly elevated which I suspect is secondary to pain.  Here she is afebrile not tachycardic or tachypneic. We discussed role of pelvic exam and vaginal bleeding.  Given that she is currently on her menstrual cycle, has seen OB/GYN for similar before and denies any concern for STI, is not sexually active after we discussed risks benefits and alternatives she made the informed decision to decline pelvic exam today and follow-up with OB/GYN instead. UA does show blood however she is not having any significant urinary symptoms and I suspect that this is from her vaginal bleeding. In the emergency room she was treated with 600 mg of ibuprofen and  given a hot pack after which her pain decreased to a 2, she felt much better and stated that she was ready to go home. We discussed use of OTC medications and conservative care for her symptoms including heat and she states her understanding. Recommended OB/GYN follow-up, and that she stop taking the estrogen containing birth control unless advised to take it by her OB/GYN.  Return precautions were discussed with patient who states their  understanding.  At the time of discharge patient denied any unaddressed complaints or concerns.  Patient is agreeable for discharge home.  Note: Portions of this report may have been transcribed using voice recognition software. Every effort was made to ensure accuracy; however, inadvertent computerized transcription errors may be present  Final Clinical Impression(s) / ED Diagnoses Final diagnoses:  Painful menstrual periods  Abnormal menstrual periods    Rx / DC Orders ED Discharge Orders         Ordered    ibuprofen (ADVIL) 600 MG tablet  Every 6 hours PRN        01/29/21 1858           Ollen Gross 01/29/21 2003    Pattricia Boss, MD 01/30/21 1506

## 2021-01-29 NOTE — ED Triage Notes (Signed)
[employer] interpreter used for triage.  C/o lower abd pain since Tuesday.  Started menstrual cycle on Monday.  Denies nausea, vomiting, and diarrhea.  Hx of fibroids.

## 2021-01-29 NOTE — ED Triage Notes (Signed)
Emergency Medicine Provider Triage Evaluation Note  Carla Fletcher , a 45 y.o. female  was evaluated in triage.  Pt complains of lower abdominal pain. On menstrual cycle Monday, still having vaginal bleeding.   Review of Systems  Positive: Abdominal pain, vaginal bleeding  Negative: Fevers, diarrhea, nausea, vomiting.  Dysuria   Physical Exam  BP (!) 107/56 (BP Location: Right Arm)   Pulse 72   Resp 20   LMP 01/24/2021   SpO2 100%  Gen:   Awake, no distress   HEENT:  Atraumatic  Resp:  Normal effort  Cardiac:  Normal rate Abd:   Suprapubic pain  MSK:   Moves extremities without difficulty  Neuro:  Speech clear  Medical Decision Making  Medically screening exam initiated at 3:41 PM.  Appropriate orders placed.  Carla Fletcher was informed that the remainder of the evaluation will be completed by another provider, this initial triage assessment does not replace that evaluation, and the importance of remaining in the ED until their evaluation is complete.  Clinical Impression  MSE was initiated and I personally evaluated the patient and placed orders (if any) at  3:44 PM on January 29, 2021.  The patient appears stable so that the remainder of the MSE may be completed by another provider.    Alfredia Client, PA-C 01/29/21 1544

## 2021-02-05 NOTE — Progress Notes (Signed)
Reviewed and agree with documentation and assessment and plan. K. Veena Phuong Moffatt , MD   

## 2021-02-18 NOTE — Telephone Encounter (Signed)
Spoke with patient via [employer], [place] (ID#: [id number]) regarding surgery benefits. Patient acknowledges understanding of information presented. Patient is aware that benefits presented are professional benefits only. Patient is aware that once surgery is scheduled, the hospital will call with separate benefits. See account note.  Routing to [employer], RMA, for surgery scheduling.

## 2021-03-04 NOTE — Telephone Encounter (Signed)
Juliann Pulse,   Do you know which surgery this patient plans to proceed with?

## 2021-03-07 NOTE — Telephone Encounter (Signed)
I do not know. Sorry

## 2021-03-07 NOTE — Telephone Encounter (Signed)
Patient came into office. Spoke with patient with Rosemarie Ax.  Patient is still unsure if she wants to proceed with Hysteroscopy D&C with or without Sonata.  Recommended OV with Dr. Dellis Filbert for further discussion.  OV scheduled for 5/25 at 0930.  Reviewed potential surgery dates, will proceed with scheduling once OV completed with Dr. Dellis Filbert.   Routing to Dr. Ileene Musa  CC: Routing to [facility] for benefits.  Patient wants benefits with and without Sonata procedure.

## 2021-03-09 ENCOUNTER — Other Ambulatory Visit: Payer: Self-pay

## 2021-03-09 ENCOUNTER — Ambulatory Visit (INDEPENDENT_AMBULATORY_CARE_PROVIDER_SITE_OTHER): Payer: Self-pay | Admitting: Obstetrics & Gynecology

## 2021-03-09 ENCOUNTER — Encounter: Payer: Self-pay | Admitting: Obstetrics & Gynecology

## 2021-03-09 ENCOUNTER — Telehealth: Payer: Self-pay | Admitting: *Deleted

## 2021-03-09 VITALS — BP 122/80

## 2021-03-09 DIAGNOSIS — D250 Submucous leiomyoma of uterus: Secondary | ICD-10-CM

## 2021-03-09 DIAGNOSIS — D251 Intramural leiomyoma of uterus: Secondary | ICD-10-CM

## 2021-03-09 DIAGNOSIS — N920 Excessive and frequent menstruation with regular cycle: Secondary | ICD-10-CM

## 2021-03-09 NOTE — Telephone Encounter (Signed)
-----   Message from Princess Bruins, MD sent at 03/09/2021 10:04 AM EDT ----- Regarding: Schedule surgery Surgery:  Hysteroscopy with Dilation and Curettage, Sonata Procedure, Myosure Excision  Diagnosis: Menorrhagia, Intramural fibroids and 1 Submucosal fibroid  Location: [place]  Status: Outpatient  Time: 77 Minutes  Assistant: N/A  Urgency: First Available  Pre-Op Appointment: Completed  Post-Op Appointment(s): 2 Weeks  Time Out Of Work: Day Of Surgery, 1 Day Post Op

## 2021-03-09 NOTE — Progress Notes (Signed)
    Carla Fletcher 10/13/1976 [id number]        45 y.o.  G0  RP: Menorrhagia/Dysmenorrhea with Fibroids for counseling on management  HPI: Menorrhagia mildly improved on the Progestin only BCPs.  Severe menstrual cramps.  Last Hb improved at 12.9.  Pelvic US 12/2020 showing 2 IM Fibroids and 1 SM Fibroid.   OB History  Gravida Para Term Preterm AB Living  0 0 0 0 0 0  SAB IAB Ectopic Multiple Live Births  0 0 0 0 0    Past medical history,surgical history, problem list, medications, allergies, family history and social history were all reviewed and documented in the EPIC chart.   Directed ROS with pertinent positives and negatives documented in the history of present illness/assessment and plan.  Exam:  Vitals:   03/09/21 0932  BP: 122/80   General appearance:  Normal  Gynecologic exam: Deferred  Pelvic US 01/05/2021:  T/V images. Anteverted uterus with 2 intramural fibroids as well as 1 submucosal fibroid measured at 2.1 cm. Thickened endometrial lining with a submucosal fibroid noted. The endometrial lining is measured at 23.71 mm. Both ovaries are normal in size with normal follicular pattern. A dominant simple follicle is present on the left ovary. No adnexal mass seen. No free fluid in the posterior cul-de-sac.   Assessment/Plan:  45 y.o. G0  1. Menorrhagia with regular cycles Pelvic US findings reviewed. Decision to proceed with Hysteroscopy D&C myosure excision with Sonata procedure.  Surgery and risks thoroughly reviewed with patient.  2. Intramural and submucous leiomyoma of uterus As above.  Other orders - ferrous sulfate 325 (65 FE) MG tablet; Take 325 mg by mouth daily with breakfast.                        Patient was counseled as to the risk of surgery to include the following:  1. Infection (prohylactic antibiotics will be administered)  2. DVT/Pulmonary Embolism (prophylactic pneumo compression stockings will be used)  3.Trauma to  internal organs requiring additional surgical procedure to repair any injury to internal organs requiring perhaps additional hospitalization days.  4.Hemmorhage requiring transfusion and blood products which carry risks such as anaphylactic reaction, hepatitis and AIDS  Patient had received literature information on the procedure scheduled and all her questions were answered and fully accepts all risk.   Princess Bruins MD, 9:59 AM 03/09/2021

## 2021-03-09 NOTE — Telephone Encounter (Signed)
Spoke with patient while in office.  Patient request to proceed with surgery on 03/30/21.  Advised I will have [place] call to review surgery instructions once surgery date confirmed. Patient agreeable.  Benefits reviewed with patient by business office while in office.   Surgery request sent for 03/30/21

## 2021-03-10 NOTE — Telephone Encounter (Signed)
Rosemarie Ax called patient to review surgery date, time and instructions.  Surgery date request confirmed.  Advised surgery is scheduled for 03/30/21, [address] [facility]. Surgery instruction sheet and hospital brochure reviewed, printed copy will be mailed Routing to provider. Encounter closed.    .  Cc: KimAlexis

## 2021-03-14 ENCOUNTER — Encounter: Payer: Self-pay | Admitting: Obstetrics & Gynecology

## 2021-03-15 NOTE — Telephone Encounter (Signed)
Spoke with patient regarding surgery benefits. Patient acknowledges understanding of information presented. Patient is aware that benefits presented are professional benefits only. Patient is aware that once surgery is scheduled, the hospital will call with separate benefits. See account note.

## 2021-03-17 ENCOUNTER — Other Ambulatory Visit: Payer: Self-pay

## 2021-03-17 ENCOUNTER — Encounter: Payer: Self-pay | Admitting: Obstetrics & Gynecology

## 2021-03-17 ENCOUNTER — Ambulatory Visit (INDEPENDENT_AMBULATORY_CARE_PROVIDER_SITE_OTHER): Payer: Self-pay | Admitting: Obstetrics & Gynecology

## 2021-03-17 VITALS — BP 122/78

## 2021-03-17 DIAGNOSIS — R300 Dysuria: Secondary | ICD-10-CM

## 2021-03-17 MED ORDER — FLUCONAZOLE 150 MG PO TABS: 150.0000 mg | ORAL_TABLET | Freq: Every day | ORAL | 1 refills | Status: AC

## 2021-03-17 MED ORDER — SULFAMETHOXAZOLE-TRIMETHOPRIM 800-160 MG PO TABS: 1.0000 | ORAL_TABLET | Freq: Two times a day (BID) | ORAL | 0 refills | Status: AC

## 2021-03-17 NOTE — Progress Notes (Signed)
    Carla Fletcher 09/14/1976 [id number]        45 y.o.  G0   RP: Urinary frequency/dysuria  HPI: C/O urinary frequency and dysuria.  Some vaginal bleeding and cramping on Norethindrone, scheduling Sonata procedure for Fibroids.  No fever.   OB History  Gravida Para Term Preterm AB Living  0 0 0 0 0 0  SAB IAB Ectopic Multiple Live Births  0 0 0 0 0    Past medical history,surgical history, problem list, medications, allergies, family history and social history were all reviewed and documented in the EPIC chart.   Directed ROS with pertinent positives and negatives documented in the history of present illness/assessment and plan.  Exam:  Vitals:   03/17/21 0957  BP: 122/78   General appearance:  Normal  CVAT Neg bilaterally  Abdomen: Normal  Gynecologic exam: Deferred  U/A: Yellow clear, protein negative, nitrites negative, white blood cells 0-5, red blood cells 40-60, few bacteria.   Assessment/Plan:  45 y.o. G0  1. Dysuria Dysuria with mildly perturbed urine analysis.  Blood probably coming from the vagina.  Decision to treat with Bactrim DS 1 tablet per mouth twice a day for 3 days.  Pending urine culture.  We will follow with fluconazole 150 mg 1 tablet per mouth daily for 3 days to treat or prevent yeast vaginitis.  Usage reviewed and prescriptions sent to pharmacy. - Urinalysis,Complete w/RFL Culture  Other orders - norethindrone (MICRONOR) 0.35 MG tablet; Take 1 tablet by mouth daily. - sulfamethoxazole-trimethoprim (BACTRIM DS) 800-160 MG tablet; Take 1 tablet by mouth 2 (two) times daily for 3 days. - fluconazole (DIFLUCAN) 150 MG tablet; Take 1 tablet (150 mg total) by mouth daily for 3 days.  Princess Bruins MD, 10:00 AM 03/17/2021

## 2021-03-20 LAB — URINE CULTURE
MICRO NUMBER:: 11960982
SPECIMEN QUALITY:: ADEQUATE

## 2021-03-20 LAB — URINALYSIS, COMPLETE W/RFL CULTURE
Bilirubin Urine: NEGATIVE
Glucose, UA: NEGATIVE
Hyaline Cast: NONE SEEN /LPF
Ketones, ur: NEGATIVE
Leukocyte Esterase: NEGATIVE
Nitrites, Initial: NEGATIVE
Protein, ur: NEGATIVE
Specific Gravity, Urine: 1.01 (ref 1.001–1.035)
pH: 6.5 (ref 5.0–8.0)

## 2021-03-20 LAB — CULTURE INDICATED

## 2021-03-21 ENCOUNTER — Other Ambulatory Visit: Payer: Self-pay | Admitting: Anesthesiology

## 2021-03-21 ENCOUNTER — Telehealth: Payer: Self-pay | Admitting: *Deleted

## 2021-03-21 MED ORDER — AMOXICILLIN 500 MG PO CAPS: 500.0000 mg | ORAL_CAPSULE | Freq: Three times a day (TID) | ORAL | 0 refills | Status: AC

## 2021-03-21 NOTE — Telephone Encounter (Signed)
Call returned to patient using [place] [phone].   Patient has additional questions regarding pre-op instructions.  Reviewed pre-op instructions, patient read back instructions.  Questions answered.   Encounter closed.

## 2021-03-23 ENCOUNTER — Encounter: Payer: No Typology Code available for payment source | Admitting: Gastroenterology

## 2021-03-25 ENCOUNTER — Encounter (HOSPITAL_BASED_OUTPATIENT_CLINIC_OR_DEPARTMENT_OTHER): Payer: Self-pay | Admitting: Obstetrics & Gynecology

## 2021-03-28 ENCOUNTER — Other Ambulatory Visit: Payer: Self-pay

## 2021-03-28 ENCOUNTER — Encounter (HOSPITAL_BASED_OUTPATIENT_CLINIC_OR_DEPARTMENT_OTHER): Payer: Self-pay | Admitting: Obstetrics & Gynecology

## 2021-03-28 NOTE — Progress Notes (Signed)
Spoke w/ via phone for pre-op interview---Pt, thru [facility] interpreter ID# [id number] Lab needs dos----  CBC, T&S, Urine preg             Lab results------ no COVID test -----patient states asymptomatic no test needed  Arrive at -------  0930 on 03-30-2021 NPO after [place] Med rec completed Medications to take morning of surgery ----- NONE Diabetic medication ----- n/a Patient instructed no nail polish to be worn day of surgery Patient instructed to bring photo id and insurance card day of surgery  Patient aware to have Driver (ride ) / caregiver for 24 hours after surgery -- sister, Landscape architect Patient Special Instructions ----- n/a Pre-Op special Istructions ----- requested spanish interpreter via email to [facility] interpreting  (pt did not have gender preference), requested confimation  Patient verbalized understanding of instructions that were given at this phone interview. Patient denies shortness of breath, chest pain, fever, cough at this phone interview.

## 2021-03-30 ENCOUNTER — Encounter (HOSPITAL_BASED_OUTPATIENT_CLINIC_OR_DEPARTMENT_OTHER)
Admission: RE | Disposition: A | Discharge status: Home / Self Care | Payer: Self-pay | Source: Home / Self Care | Attending: Obstetrics & Gynecology

## 2021-03-30 ENCOUNTER — Ambulatory Visit (HOSPITAL_BASED_OUTPATIENT_CLINIC_OR_DEPARTMENT_OTHER)
Admission: RE | Admit: 2021-03-30 | Discharge: 2021-03-30 | Disposition: A | Discharge status: Home / Self Care | Payer: No Typology Code available for payment source | Attending: Obstetrics & Gynecology | Admitting: Obstetrics & Gynecology

## 2021-03-30 ENCOUNTER — Ambulatory Visit (HOSPITAL_BASED_OUTPATIENT_CLINIC_OR_DEPARTMENT_OTHER): Payer: No Typology Code available for payment source | Admitting: Anesthesiology

## 2021-03-30 ENCOUNTER — Encounter (HOSPITAL_BASED_OUTPATIENT_CLINIC_OR_DEPARTMENT_OTHER): Payer: Self-pay | Admitting: Obstetrics & Gynecology

## 2021-03-30 DIAGNOSIS — N920 Excessive and frequent menstruation with regular cycle: Secondary | ICD-10-CM

## 2021-03-30 DIAGNOSIS — D649 Anemia, unspecified: Secondary | ICD-10-CM | POA: Insufficient documentation

## 2021-03-30 DIAGNOSIS — Z79899 Other long term (current) drug therapy: Secondary | ICD-10-CM | POA: Insufficient documentation

## 2021-03-30 DIAGNOSIS — D250 Submucous leiomyoma of uterus: Secondary | ICD-10-CM | POA: Insufficient documentation

## 2021-03-30 DIAGNOSIS — N946 Dysmenorrhea, unspecified: Secondary | ICD-10-CM | POA: Insufficient documentation

## 2021-03-30 DIAGNOSIS — D509 Iron deficiency anemia, unspecified: Secondary | ICD-10-CM

## 2021-03-30 DIAGNOSIS — D251 Intramural leiomyoma of uterus: Secondary | ICD-10-CM | POA: Insufficient documentation

## 2021-03-30 HISTORY — DX: Presence of dental prosthetic device (complete) (partial): Z97.2

## 2021-03-30 HISTORY — DX: Excessive and frequent menstruation with regular cycle: N92.0

## 2021-03-30 HISTORY — DX: Presence of dental prosthetic device (complete) (partial): K08.109

## 2021-03-30 HISTORY — PX: DILATATION & CURETTAGE/HYSTEROSCOPY WITH MYOSURE: SHX6511

## 2021-03-30 HISTORY — DX: Leiomyoma of uterus, unspecified: D25.9

## 2021-03-30 LAB — CBC
HCT: 40.3 % (ref 36.0–46.0)
Hemoglobin: 13.1 g/dL (ref 12.0–15.0)
MCH: 29.3 pg (ref 26.0–34.0)
MCHC: 32.5 g/dL (ref 30.0–36.0)
MCV: 90.2 fL (ref 80.0–100.0)
Platelets: 364 10*3/uL (ref 150–400)
RBC: 4.47 MIL/uL (ref 3.87–5.11)
RDW: 15.9 % — ABNORMAL HIGH (ref 11.5–15.5)
WBC: 6.6 10*3/uL (ref 4.0–10.5)
nRBC: 0 % (ref 0.0–0.2)

## 2021-03-30 LAB — TYPE AND SCREEN
ABO/RH(D): O POS
Antibody Screen: NEGATIVE

## 2021-03-30 LAB — POCT PREGNANCY, URINE: Preg Test, Ur: NEGATIVE

## 2021-03-30 SURGERY — DILATATION & CURETTAGE/HYSTEROSCOPY WITH MYOSURE
Anesthesia: General | Site: Vagina

## 2021-03-30 MED ORDER — FENTANYL CITRATE (PF) 100 MCG/2ML IJ SOLN
25.0000 ug | INTRAMUSCULAR | Status: DC | PRN
  Administered 2021-03-30: 25 ug via INTRAVENOUS

## 2021-03-30 MED ORDER — ACETAMINOPHEN 500 MG PO TABS
1000.0000 mg | ORAL_TABLET | Freq: Once | ORAL | Status: AC
  Administered 2021-03-30: 1000 mg via ORAL

## 2021-03-30 MED ORDER — LIDOCAINE HCL 1 % IJ SOLN
INTRAMUSCULAR | Status: DC | PRN
  Administered 2021-03-30: 10 mL

## 2021-03-30 MED ORDER — ACETAMINOPHEN 500 MG PO TABS
ORAL_TABLET | ORAL | Status: AC
  Filled 2021-03-30: qty 2

## 2021-03-30 MED ORDER — SODIUM CHLORIDE 0.9 % IR SOLN
Status: DC | PRN
  Administered 2021-03-30 (×4): 3000 mL

## 2021-03-30 MED ORDER — PHENYLEPHRINE 40 MCG/ML (10ML) SYRINGE FOR IV PUSH (FOR BLOOD PRESSURE SUPPORT)
PREFILLED_SYRINGE | INTRAVENOUS | Status: DC | PRN
  Administered 2021-03-30 (×6): 80 ug via INTRAVENOUS
  Administered 2021-03-30: 120 ug via INTRAVENOUS
  Administered 2021-03-30 (×2): 80 ug via INTRAVENOUS

## 2021-03-30 MED ORDER — PHENYLEPHRINE 40 MCG/ML (10ML) SYRINGE FOR IV PUSH (FOR BLOOD PRESSURE SUPPORT)
PREFILLED_SYRINGE | INTRAVENOUS | Status: AC
  Filled 2021-03-30: qty 10

## 2021-03-30 MED ORDER — MIDAZOLAM HCL 5 MG/5ML IJ SOLN
INTRAMUSCULAR | Status: DC | PRN
  Administered 2021-03-30: 2 mg via INTRAVENOUS

## 2021-03-30 MED ORDER — DEXAMETHASONE SODIUM PHOSPHATE 10 MG/ML IJ SOLN
INTRAMUSCULAR | Status: DC | PRN
  Administered 2021-03-30: 10 mg via INTRAVENOUS

## 2021-03-30 MED ORDER — ONDANSETRON HCL 4 MG/2ML IJ SOLN
INTRAMUSCULAR | Status: AC
  Filled 2021-03-30: qty 2

## 2021-03-30 MED ORDER — FENTANYL CITRATE (PF) 100 MCG/2ML IJ SOLN
INTRAMUSCULAR | Status: AC
  Filled 2021-03-30: qty 2

## 2021-03-30 MED ORDER — DEXAMETHASONE SODIUM PHOSPHATE 10 MG/ML IJ SOLN
INTRAMUSCULAR | Status: AC
  Filled 2021-03-30: qty 1

## 2021-03-30 MED ORDER — LIDOCAINE HCL (PF) 2 % IJ SOLN
INTRAMUSCULAR | Status: AC
  Filled 2021-03-30: qty 5

## 2021-03-30 MED ORDER — LIDOCAINE 2% (20 MG/ML) 5 ML SYRINGE
INTRAMUSCULAR | Status: DC | PRN
  Administered 2021-03-30: 50 mg via INTRAVENOUS

## 2021-03-30 MED ORDER — PROPOFOL 10 MG/ML IV BOLUS
INTRAVENOUS | Status: AC
  Filled 2021-03-30: qty 20

## 2021-03-30 MED ORDER — POVIDONE-IODINE 10 % EX SWAB: 2.0000 "application " | Freq: Once | CUTANEOUS | Status: DC

## 2021-03-30 MED ORDER — CEFAZOLIN SODIUM-DEXTROSE 2-4 GM/100ML-% IV SOLN
2.0000 g | INTRAVENOUS | Status: AC
  Administered 2021-03-30: 2 g via INTRAVENOUS

## 2021-03-30 MED ORDER — ONDANSETRON HCL 4 MG/2ML IJ SOLN
INTRAMUSCULAR | Status: DC | PRN
  Administered 2021-03-30: 4 mg via INTRAVENOUS

## 2021-03-30 MED ORDER — LACTATED RINGERS IV SOLN: INTRAVENOUS | Status: DC

## 2021-03-30 MED ORDER — FENTANYL CITRATE (PF) 250 MCG/5ML IJ SOLN
INTRAMUSCULAR | Status: AC
  Filled 2021-03-30: qty 5

## 2021-03-30 MED ORDER — KETOROLAC TROMETHAMINE 30 MG/ML IJ SOLN
INTRAMUSCULAR | Status: DC | PRN
  Administered 2021-03-30: 30 mg via INTRAVENOUS

## 2021-03-30 MED ORDER — EPHEDRINE SULFATE-NACL 50-0.9 MG/10ML-% IV SOSY
PREFILLED_SYRINGE | INTRAVENOUS | Status: DC | PRN
  Administered 2021-03-30: 10 mg via INTRAVENOUS
  Administered 2021-03-30 (×3): 5 mg via INTRAVENOUS

## 2021-03-30 MED ORDER — CEFAZOLIN SODIUM-DEXTROSE 2-4 GM/100ML-% IV SOLN
INTRAVENOUS | Status: AC
  Filled 2021-03-30: qty 100

## 2021-03-30 MED ORDER — MIDAZOLAM HCL 2 MG/2ML IJ SOLN
INTRAMUSCULAR | Status: AC
  Filled 2021-03-30: qty 2

## 2021-03-30 MED ORDER — KETOROLAC TROMETHAMINE 30 MG/ML IJ SOLN
INTRAMUSCULAR | Status: AC
  Filled 2021-03-30: qty 1

## 2021-03-30 MED ORDER — FENTANYL CITRATE (PF) 100 MCG/2ML IJ SOLN
INTRAMUSCULAR | Status: DC | PRN
  Administered 2021-03-30 (×4): 50 ug via INTRAVENOUS

## 2021-03-30 MED ORDER — EPHEDRINE 5 MG/ML INJ
INTRAVENOUS | Status: AC
  Filled 2021-03-30: qty 10

## 2021-03-30 MED ORDER — PROPOFOL 10 MG/ML IV BOLUS
INTRAVENOUS | Status: DC | PRN
  Administered 2021-03-30: 90 mg via INTRAVENOUS
  Administered 2021-03-30: 30 mg via INTRAVENOUS

## 2021-03-30 SURGICAL SUPPLY — 29 items
CATH ROBINSON RED A/P 16FR (CATHETERS) ×3 IMPLANT
COVER WAND RF STERILE (DRAPES) ×3 IMPLANT
DEVICE MYOSURE LITE (MISCELLANEOUS) IMPLANT
DEVICE MYOSURE REACH (MISCELLANEOUS) IMPLANT
DILATOR CANAL MILEX (MISCELLANEOUS) IMPLANT
ELECT DISPERSIVE SONATA (MISCELLANEOUS) ×6 IMPLANT
ELECT REM PT RETURN 9FT ADLT (ELECTROSURGICAL)
ELECTRODE REM PT RTRN 9FT ADLT (ELECTROSURGICAL) IMPLANT
GAUZE 4X4 16PLY RFD (DISPOSABLE) ×3 IMPLANT
GLOVE SURG ENC MOIS LTX SZ6.5 (GLOVE) ×3 IMPLANT
GLOVE SURG UNDER POLY LF SZ6.5 (GLOVE) ×3 IMPLANT
GLOVE SURG UNDER POLY LF SZ7 (GLOVE) ×9 IMPLANT
GLOVE SURG UNDER POLY LF SZ7.5 (GLOVE) ×3 IMPLANT
GOWN STRL REUS W/ TWL LRG LVL3 (GOWN DISPOSABLE) ×1 IMPLANT
GOWN STRL REUS W/TWL LRG LVL3 (GOWN DISPOSABLE) ×8 IMPLANT
HANDPIECE RFA SONATA (MISCELLANEOUS) ×3 IMPLANT
IV NS IRRIG 3000ML ARTHROMATIC (IV SOLUTION) ×12 IMPLANT
KIT PROCEDURE FLUENT (KITS) ×3 IMPLANT
KIT TURNOVER CYSTO (KITS) ×3 IMPLANT
MYOSURE XL FIBROID (MISCELLANEOUS) ×3
PACK VAGINAL MINOR WOMEN LF (CUSTOM PROCEDURE TRAY) ×3 IMPLANT
PAD OB MATERNITY 4.3X12.25 (PERSONAL CARE ITEMS) ×3 IMPLANT
PAD PREP 24X48 CUFFED NSTRL (MISCELLANEOUS) ×3 IMPLANT
SEAL CERVICAL OMNI LOK (ABLATOR) IMPLANT
SEAL ROD LENS SCOPE MYOSURE (ABLATOR) ×3 IMPLANT
SYR 50ML LL SCALE MARK (SYRINGE) ×3 IMPLANT
SYSTEM TISS REMOVAL MYOSURE XL (MISCELLANEOUS) ×1 IMPLANT
TOWEL OR 17X26 10 PK STRL BLUE (TOWEL DISPOSABLE) ×3 IMPLANT
WATER STERILE IRR 500ML POUR (IV SOLUTION) ×3 IMPLANT

## 2021-03-30 NOTE — Anesthesia Procedure Notes (Signed)
Procedure Name: LMA Insertion Date/Time: 03/30/2021 11:25 AM Performed by: Justice Rocher, CRNA Pre-anesthesia Checklist: Timeout performed Patient Re-evaluated:Patient Re-evaluated prior to induction Oxygen Delivery Method: Circle system utilized Preoxygenation: Pre-oxygenation with 100% oxygen Induction Type: IV induction Ventilation: Mask ventilation without difficulty LMA: LMA inserted LMA Size: 3.0 Number of attempts: 1 Airway Equipment and Method: Bite block Placement Confirmation: positive ETCO2, breath sounds checked- equal and bilateral and CO2 detector Tube secured with: Tape Dental Injury: Teeth and Oropharynx as per pre-operative assessment

## 2021-03-30 NOTE — Anesthesia Preprocedure Evaluation (Addendum)
Anesthesia Evaluation  Patient identified by MRN, date of birth, ID band Patient awake    Reviewed: Allergy & Precautions, H&P , NPO status , Patient's Chart, lab work & pertinent test results  Airway Mallampati: II  TM Distance: >3 FB Neck ROM: Full    Dental no notable dental hx. (+) Edentulous Upper, Edentulous Lower, Dental Advisory Given   Pulmonary neg pulmonary ROS,    Pulmonary exam normal breath sounds clear to auscultation       Cardiovascular negative cardio ROS   Rhythm:Regular Rate:Normal     Neuro/Psych negative neurological ROS  negative psych ROS   GI/Hepatic negative GI ROS, Neg liver ROS,   Endo/Other  negative endocrine ROS  Renal/GU negative Renal ROS  negative genitourinary   Musculoskeletal   Abdominal   Peds  Hematology  (+) Blood dyscrasia, anemia ,   Anesthesia Other Findings   Reproductive/Obstetrics negative OB ROS                            Anesthesia Physical Anesthesia Plan  ASA: 2  Anesthesia Plan: General   Post-op Pain Management:    Induction: Intravenous  PONV Risk Score and Plan: 4 or greater and Ondansetron, Dexamethasone and Midazolam  Airway Management Planned: LMA  Additional Equipment:   Intra-op Plan:   Post-operative Plan: Extubation in OR  Informed Consent: I have reviewed the patients History and Physical, chart, labs and discussed the procedure including the risks, benefits and alternatives for the proposed anesthesia with the patient or authorized representative who has indicated his/her understanding and acceptance.     Dental advisory given and Interpreter used for interveiw  Plan Discussed with: CRNA  Anesthesia Plan Comments:        Anesthesia Quick Evaluation

## 2021-03-30 NOTE — H&P (Signed)
Carla Fletcher is an 45 y.o. female. G0   RP: Menorrhagia/Dysmenorrhea with Fibroids for Hysteroscopy D&C myosure excision with Sonata procedure   HPI: Menorrhagia mildly improved on the Progestin only BCPs.  Severe menstrual cramps.  Last Hb improved at 12.9.  Pelvic US 12/2020 showing 2 IM Fibroids and 1 SM Fibroid.   Pertinent Gynecological History: Menses: Menorrhagia and Dysmenorrhea Blood transfusions: none Sexually transmitted diseases: no past history Last mammogram: normal  Last pap: normal  OB History: G0  Menstrual History: Patient's last menstrual period was 03/25/2021 (exact date).    Past Medical History:  Diagnosis Date   Full dentures    Iron deficiency anemia    symptomatic ,  12-2020 treated with one unit PRBC and IV Iron infusions x5 from 12-15-2020  to 12-19-2020   Menorrhagia with regular cycle    Uterine fibroid     Past Surgical History:  Procedure Laterality Date   BREAST EXCISIONAL BIOPSY Left 2018   benign   BREAST LUMPECTOMY WITH RADIOACTIVE SEED LOCALIZATION Left 03/26/2017   Procedure: LEFT BREAST LUMPECTOMY X2 WITH RADIOACTIVE SEED LOCALIZATION;  Surgeon: Jovita Kussmaul, MD;  Location: [place];  Service: General;  Laterality: Left;    Family History  Problem Relation Age of Onset   Hypertension Mother    Recurrent abdominal pain Mother    Healthy Father    Hypertension Maternal Grandmother        died age 65   Hypotension Paternal Grandfather    Colon cancer Neg Hx    Rectal cancer Neg Hx    Esophageal cancer Neg Hx    Liver cancer Neg Hx    Stomach cancer Neg Hx     Social History:  reports that she has never smoked. She has never used smokeless tobacco. She reports that she does not drink alcohol and does not use drugs.  Allergies: No Known Allergies  Medications Prior to Admission  Medication Sig Dispense Refill Last Dose   acetaminophen (TYLENOL) 500 MG tablet Take 500 mg by mouth every 6 (six) hours as needed for  headache.   03/29/2021   ferrous sulfate 325 (65 FE) MG tablet Take 325 mg by mouth daily with breakfast.   03/28/2021   fluconazole (DIFLUCAN) 150 MG tablet Take 150 mg by mouth daily.   03/28/2021   norethindrone (MICRONOR) 0.35 MG tablet Take 1 tablet by mouth every evening.   03/28/2021   polyethylene glycol (MIRALAX / GLYCOLAX) 17 g packet Take 17 g by mouth daily as needed.   Past Month    REVIEW OF SYSTEMS: A ROS was performed and pertinent positives and negatives are included in the history.  GENERAL: No fevers or chills. HEENT: No change in vision, no earache, sore throat or sinus congestion. NECK: No pain or stiffness. CARDIOVASCULAR: No chest pain or pressure. No palpitations. PULMONARY: No shortness of breath, cough or wheeze. GASTROINTESTINAL: No abdominal pain, nausea, vomiting or diarrhea, melena or bright red blood per rectum. GENITOURINARY: No urinary frequency, urgency, hesitancy or dysuria. MUSCULOSKELETAL: No joint or muscle pain, no back pain, no recent trauma. DERMATOLOGIC: No rash, no itching, no lesions. ENDOCRINE: No polyuria, polydipsia, no heat or cold intolerance. No recent change in weight. HEMATOLOGICAL: No anemia or easy bruising or bleeding. NEUROLOGIC: No headache, seizures, numbness, tingling or weakness. PSYCHIATRIC: No depression, no loss of interest in normal activity or change in sleep pattern.    Physical Exam: Height 5' 2.5" (1.588 m), weight 57.6 kg, last menstrual period 03/25/2021.  Results for orders placed or performed during the hospital encounter of 03/30/21 (from the past 24 hour(s))  Pregnancy, urine POC     Status: None   Collection Time: 03/30/21  9:31 AM  Result Value Ref Range   Preg Test, Ur NEGATIVE NEGATIVE   Pelvic US 01/05/2021:  T/V images.  Anteverted uterus with 2 intramural fibroids as well as 1 submucosal fibroid measured at 2.1 cm.  Thickened endometrial lining with a submucosal fibroid noted. The endometrial lining is measured at 23.71  mm.  Both ovaries are normal in size with normal follicular pattern.  A dominant simple follicle is present on the left ovary.  No adnexal mass seen.  No free fluid in the posterior cul-de-sac.     Assessment/Plan:  45 y.o. G0   1. Menorrhagia with regular cycles Pelvic US findings reviewed. Decision to proceed with Hysteroscopy D&C myosure excision with Sonata procedure.  Surgery and risks thoroughly reviewed with patient.   2. Intramural and submucous leiomyoma of uterus As above.   Other orders - ferrous sulfate 325 (65 FE) MG tablet; Take 325 mg by mouth daily with breakfast.                         Patient was counseled as to the risk of surgery to include the following:   1. Infection (prohylactic antibiotics will be administered)   2. DVT/Pulmonary Embolism (prophylactic pneumo compression stockings will be used)   3.Trauma to internal organs requiring additional surgical procedure to repair any injury to internal organs requiring perhaps additional hospitalization days.   4.Hemmorhage requiring transfusion and blood products which carry risks such as anaphylactic reaction, hepatitis and AIDS   Patient had received literature information on the procedure scheduled and all her questions were answered and fully accepts all risk.   Carla Fletcher 03/30/2021, 10:09 AM

## 2021-03-30 NOTE — Discharge Instructions (Addendum)
  DISCHARGE INSTRUCTIONS: D&C The following instructions have been prepared to help you care for yourself upon your return home.   Personal hygiene:  Use sanitary pads for vaginal drainage, not tampons.  Shower the day after your procedure.  NO tub baths, pools or Jacuzzis for 2-3 weeks.  Wipe front to back after using the bathroom.  Activity and limitations:  Do NOT drive or operate any equipment for 24 hours. The effects of anesthesia are still present and drowsiness may result.  Do NOT rest in bed all day.  Walking is encouraged.  Walk up and down stairs slowly.  You may resume your normal activity in one to two days or as indicated by your physician.  Sexual activity: NO intercourse for at least 2 weeks after the procedure, or as indicated by your physician.  Diet: Eat a light meal as desired this evening. You may resume your usual diet tomorrow.  Return to work: You may resume your work activities in one to two days or as indicated by your doctor.  What to expect after your surgery: Expect to have vaginal bleeding/discharge for 2-3 days and spotting for up to 10 days. It is not unusual to have soreness for up to 1-2 weeks. You may have a slight burning sensation when you urinate for the first day. Mild cramps may continue for a couple of days. You may have a regular period in 2-6 weeks.  Call your doctor for any of the following:  Excessive vaginal bleeding, saturating and changing one pad every hour.  Inability to urinate 6 hours after discharge from hospital.  Pain not relieved by pain medication.  Fever of 100.4 F or greater.  Unusual vaginal discharge or odor.      Post Anesthesia Home Care Instructions  Activity: Get plenty of rest for the remainder of the day. A responsible individual must stay with you for 24 hours following the procedure.  For the next 24 hours, DO NOT: -Drive a car -Paediatric nurse -Drink alcoholic beverages -Take any medication unless  instructed by your physician -Make any legal decisions or sign important papers.  Meals: Start with liquid foods such as gelatin or soup. Progress to regular foods as tolerated. Avoid greasy, spicy, heavy foods. If nausea and/or vomiting occur, drink only clear liquids until the nausea and/or vomiting subsides. Call your physician if vomiting continues.  Special Instructions/Symptoms: Your throat may feel dry or sore from the anesthesia or the breathing tube placed in your throat during surgery. If this causes discomfort, gargle with warm salt water. The discomfort should disappear within 24 hours.  If you had a scopolamine patch placed behind your ear for the management of post- operative nausea and/or vomiting:  1. The medication in the patch is effective for 72 hours, after which it should be removed.  Wrap patch in a tissue and discard in the trash. Wash hands thoroughly with soap and water. 2. You may remove the patch earlier than 72 hours if you experience unpleasant side effects which may include dry mouth, dizziness or visual disturbances. 3. Avoid touching the patch. Wash your hands with soap and water after contact with the patch.  Do not take any nonsteroidal anti inflammatories until after 7:00 pm today.

## 2021-03-30 NOTE — Transfer of Care (Signed)
Immediate Anesthesia Transfer of Care Note  Patient: Carla Fletcher  Procedure(s) Performed: DILATATION & CURETTAGE/HYSTEROSCOPY WITH MYOSURE (Vagina ) Radio Frequency Ablation with Sonata (Vagina )  Patient Location: PACU  Anesthesia Type:General  Level of Consciousness: drowsy, patient cooperative and responds to stimulation  Airway & Oxygen Therapy: Patient Spontanous Breathing  Post-op Assessment: Report given to RN and Post -op Vital signs reviewed and stable  Post vital signs: Reviewed and stable  Last Vitals:  Vitals Value Taken Time  BP 109/60 03/30/21 1315  Temp 36.4 C 03/30/21 1315  Pulse 89 03/30/21 1320  Resp 15 03/30/21 1320  SpO2 100 % 03/30/21 1320  Vitals shown include unvalidated device data.  Last Pain:  Vitals:   03/30/21 1019  TempSrc: Oral  PainSc: 4       Patients Stated Pain Goal: 3 (09/81/19 1478)  Complications: No notable events documented.

## 2021-03-30 NOTE — Op Note (Signed)
Operative Note  03/30/2021  2:37 PM  PATIENT:  Carla Fletcher  45 y.o. female  PRE-OPERATIVE DIAGNOSIS:  Menorrhagia, submucosal and intramural fibroids, secondary anemia  POST-OPERATIVE DIAGNOSIS:  Menorrhagia, submucosal and intramural fibroids, secondary anemia  PROCEDURE:  Procedure(s): DILATATION & CURETTAGE/HYSTEROSCOPY WITH MYOSURE EXCISION/Radio Frequency Ablation with Sonata  SURGEON:  Surgeon(s): Princess Bruins, MD  ANESTHESIA:   general  FINDINGS: Endometrial cavity with a 2 cm submucosal myoma completely in the cavity.  Ostia normal x 2.  Endometrium normal.  DESCRIPTION OF OPERATION: Under general anesthesia with laryngeal mask, the patient is in lithotomy position.  She is prepped with Betadine on the suprapubic, vulvar and vaginal areas.  The bladder is catheterized.  The patient is draped as usual.  Timeout is done.  The vaginal exam reveals an anteverted uterus mildly increased in volume.  No adnexal mass.  The speculum is inserted in the vagina and the anterior lip of the cervix is grasped with a tenaculum.  A paracervical block is done with lidocaine 1% a total of 10 cc at 4 and 8:00.  Dilation of the cervix with Pratt dilators up to #19 without difficulty.  Hysteroscope inserted in the intra uterine cavity.  Inspection reveals 2 normal ostia, a submucosal fibroid measuring 2 cm at the posterior aspect of the cavity centrally and completely in the cavity.  An intramural fibroid is mildly pressing on the cavity at the fundus.  Pictures taken.  MyoSure XL used to completely excise the fibroid.  About a third of the fibroid was completely detached but not removed from the cavity because we reached the maximum fluid deficit before succeeding.  This small piece should evacuate on its own eventually.  Pictures were taken after excision.  The hysteroscope with XL MyoSure was removed.  We then proceeded with a systematic curettage of the intra uterine cavity on all  surfaces with a sharp curette.  Both specimens were sent together to pathology.  We then proceeded with the [facility] procedure.  The Sonata instrument was inserted in the intra uterine cavity after dilating the cervix further to [facility] #33.  We performed a first radioablation on a 2 cm fibroid pipe 5 at 11:00.  That first ablation was 2 x 1.3 cm in size for 1 minute.  We then did 3 radioablation on a second fibroid which was 2 cm in diameter type IV at 6:00.  The first ablation was 2 x 1.3 cm for 1 minute and 6 seconds, the second ablation was 2 x 1.4 cm for 1 minute and 12 seconds and the third ablation was 2 x 1.4 cm for 1 minute and 12 seconds.  We then removed the [facility] instrument.  We removed the tenaculum.  Hemostasis was adequate.  The patient was brought to recovery room in good and stable status.  ESTIMATED BLOOD LOSS: 25 mL FLUID DEFICIT: 1650 mL  Intake/Output Summary (Last 24 hours) at 03/30/2021 1437 Last data filed at 03/30/2021 1304 Gross per 24 hour  Intake 1000 ml  Output 75 ml  Net 925 ml     BLOOD ADMINISTERED:none   LOCAL MEDICATIONS USED:  LIDOCAINE   SPECIMEN:  Source of Specimen:  Excision material from submucosal myoma and endometrial curettings  DISPOSITION OF SPECIMEN:  PATHOLOGY  COUNTS:  YES  PLAN OF CARE: Transfer to PACU  Marie-Lyne LavoieMD2:37 PM

## 2021-03-31 ENCOUNTER — Encounter (HOSPITAL_BASED_OUTPATIENT_CLINIC_OR_DEPARTMENT_OTHER): Payer: Self-pay | Admitting: Obstetrics & Gynecology

## 2021-03-31 LAB — SURGICAL PATHOLOGY

## 2021-03-31 NOTE — Anesthesia Postprocedure Evaluation (Signed)
Anesthesia Post Note  Patient: Carla Fletcher  Procedure(s) Performed: DILATATION & CURETTAGE/HYSTEROSCOPY WITH MYOSURE (Vagina ) Radio Frequency Ablation with Sonata (Vagina )     Patient location during evaluation: Other Anesthesia Type: General Level of consciousness: awake and alert Pain management: pain level controlled Vital Signs Assessment: post-procedure vital signs reviewed and stable Respiratory status: spontaneous breathing, nonlabored ventilation and respiratory function stable Cardiovascular status: blood pressure returned to baseline and stable Postop Assessment: no apparent nausea or vomiting Anesthetic complications: no   No notable events documented.  Last Vitals:  Vitals:   03/30/21 1411 03/30/21 1430  BP: 118/75 120/69  Pulse: 84 93  Resp: 19 18  Temp:  36.4 C  SpO2: 98% 99%    Last Pain:  Vitals:   03/30/21 1430  TempSrc:   PainSc: 3                  Merilee Wible,W. EDMOND

## 2021-04-04 ENCOUNTER — Encounter: Payer: Self-pay | Admitting: Anesthesiology

## 2021-04-12 ENCOUNTER — Ambulatory Visit (INDEPENDENT_AMBULATORY_CARE_PROVIDER_SITE_OTHER): Payer: Self-pay | Admitting: Obstetrics & Gynecology

## 2021-04-12 ENCOUNTER — Encounter: Payer: Self-pay | Admitting: Obstetrics & Gynecology

## 2021-04-12 ENCOUNTER — Other Ambulatory Visit: Payer: Self-pay

## 2021-04-12 VITALS — BP 126/76

## 2021-04-12 DIAGNOSIS — Z09 Encounter for follow-up examination after completed treatment for conditions other than malignant neoplasm: Secondary | ICD-10-CM

## 2021-04-12 NOTE — Progress Notes (Signed)
    Carla Fletcher 11-17-1975 [id number]        45 y.o.  G0P0000   RP: Post [place] Myosure excision 03/30/2021  HPI: Doing very well with no pelvic pain.  Mild vaginal spotting.  No fever.  Urine/BMs normal.   OB History  Gravida Para Term Preterm AB Living  0 0 0 0 0 0  SAB IAB Ectopic Multiple Live Births  0 0 0 0 0    Past medical history,surgical history, problem list, medications, allergies, family history and social history were all reviewed and documented in the EPIC chart.   Directed ROS with pertinent positives and negatives documented in the history of present illness/assessment and plan.  Exam:  Vitals:   04/12/21 1515  BP: 126/76   General appearance:  Normal  Abdomen: Normal  Gynecologic exam: Vulva normal.  Bimanual exam:  Uterus AV, normal volume, mobile, NT.  No adnexal mass, NT   Assessment/Plan:  45 y.o. G0P0000   1. Status post gynecological surgery, follow-up exam  Very good postop healing.  No Cx.  Patho benign.  Will f/u in 05/2021 for Annual Gyn visit.  Princess Bruins MD, 3:39 PM 04/12/2021

## 2021-06-09 ENCOUNTER — Other Ambulatory Visit: Payer: Self-pay

## 2021-06-09 ENCOUNTER — Ambulatory Visit (AMBULATORY_SURGERY_CENTER): Payer: Self-pay

## 2021-06-09 VITALS — Ht 62.25 in | Wt 130.0 lb

## 2021-06-09 DIAGNOSIS — D509 Iron deficiency anemia, unspecified: Secondary | ICD-10-CM

## 2021-06-09 NOTE — Progress Notes (Signed)
No allergies to soy or egg Pt is not on blood thinners or diet pills Denies issues with sedation/intubation Denies atrial flutter/fib Denies constipation   Pt is aware of Covid safety and care partner requirements.    Spanish Interpreter is Janith Lima  Pt states she goes every day.  Only uses Miralax if she needs it.  Instructed to stop iron 5 days before her procedure.

## 2021-06-21 ENCOUNTER — Telehealth: Payer: Self-pay | Admitting: Gastroenterology

## 2021-06-21 NOTE — Telephone Encounter (Signed)
Please check if patient received prep instructions, she cant eat solid food day before procedure, doesn't have to fast for 5 days

## 2021-06-21 NOTE — Telephone Encounter (Signed)
Tried to call patient back with the help of Catherin Thelma Barge, certified interpreter. No answer. No voicemail.

## 2021-06-21 NOTE — Telephone Encounter (Signed)
Hi Dr. Silverio Decamp, this patient just called to cancel procedure that was scheduled on Thursday, September 8 because she thought that she was not able to eat any solid food since 5 days prior to procedure. Pt stated to be hungry and could not longer go without eating anything. Patient will call back to reschedule. Thank you.

## 2021-06-23 ENCOUNTER — Encounter: Payer: No Typology Code available for payment source | Admitting: Gastroenterology

## 2021-07-06 ENCOUNTER — Other Ambulatory Visit: Payer: Self-pay | Admitting: Obstetrics and Gynecology

## 2021-07-06 DIAGNOSIS — Z1231 Encounter for screening mammogram for malignant neoplasm of breast: Secondary | ICD-10-CM

## 2021-07-07 ENCOUNTER — Other Ambulatory Visit: Payer: Self-pay | Admitting: Obstetrics and Gynecology

## 2021-07-07 DIAGNOSIS — Z1231 Encounter for screening mammogram for malignant neoplasm of breast: Secondary | ICD-10-CM

## 2021-07-15 ENCOUNTER — Ambulatory Visit: Payer: Self-pay | Admitting: Obstetrics & Gynecology

## 2021-08-16 ENCOUNTER — Ambulatory Visit
Admission: RE | Admit: 2021-08-16 | Discharge: 2021-08-16 | Disposition: A | Discharge status: Home / Self Care | Payer: No Typology Code available for payment source | Source: Ambulatory Visit | Attending: Obstetrics and Gynecology | Admitting: Obstetrics and Gynecology

## 2021-08-16 ENCOUNTER — Other Ambulatory Visit: Payer: Self-pay

## 2021-08-16 ENCOUNTER — Encounter (INDEPENDENT_AMBULATORY_CARE_PROVIDER_SITE_OTHER): Payer: Self-pay

## 2021-08-16 ENCOUNTER — Ambulatory Visit: Payer: Self-pay | Admitting: *Deleted

## 2021-08-16 VITALS — BP 120/72 | Wt 129.7 lb

## 2021-08-16 DIAGNOSIS — Z1231 Encounter for screening mammogram for malignant neoplasm of breast: Secondary | ICD-10-CM

## 2021-08-16 DIAGNOSIS — Z1239 Encounter for other screening for malignant neoplasm of breast: Secondary | ICD-10-CM

## 2021-08-16 NOTE — Patient Instructions (Signed)
Explained breast self awareness with Shirelle Payes-Martinez. Patient did not need a Pap smear today due to last Pap smear and HPV typing was 05/20/2020. Let her know [facility] will cover Pap smears and HPV typing every 5 years unless has a history of abnormal Pap smears. Referred patient to the [facility] for a screening mammogram on mobile unit. Appointment scheduled Tuesday, August 16, 2021 at 1050. Patient escorted to the mobile unit following [facility] appointment for her screening mammogram. Let [facility] know [facility] will follow up with her within the next couple weeks with results of her mammogram by letter or phone. Janara Payes-Martinez verbalized understanding.  Jullian Previti, Arvil Chaco, RN 10:32 AM

## 2021-08-16 NOTE — Progress Notes (Signed)
[name] is a 45 y.o. female who presents to [facility] clinic today with no complaints.    Pap Smear: Pap smear not completed today. Last Pap smear was 05/20/2020 at [facility] clinic and was normal with negative HPV. Per patient has no history of an abnormal Pap smear. Last Pap smear result is available in Epic.   Physical exam: Breasts Breasts symmetrical. No skin abnormalities right breast. 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 No nipple retraction bilateral breasts. No nipple discharge bilateral breasts. No lymphadenopathy. No lumps palpated left breast. 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 No complaints of pain or tenderness on exam.       MM Breast Surgical Specimen  Result Date: 03/26/2017 CLINICAL DATA:  Status post seed localized excision for complex sclerosing lesion of the left breast (2 of 2). EXAM: SPECIMEN RADIOGRAPH OF THE LEFT BREAST COMPARISON:  Previous exam(s). FINDINGS: Status post excision of the left breast. The radioactive seed and X shaped biopsy marker clip are present, completely intact, and were marked for pathology. IMPRESSION: Specimen radiograph of the left breast. Electronically Signed   By: Nolon Nations M.D.   On: 03/26/2017 14:13   MM Breast Surgical Specimen  Result Date: 03/26/2017 CLINICAL DATA:  Status post seed localization for a complex sclerosing lesion in the left breast. ( 1 of 2) EXAM: SPECIMEN RADIOGRAPH OF THE LEFT BREAST COMPARISON:  Previous exam(s). FINDINGS: Status post excision of the left breast. The radioactive seed and coil shaped biopsy marker clip are present, completely intact, and were marked for pathology. IMPRESSION: Specimen radiograph of the left breast. Electronically Signed   By: Nolon Nations M.D.   On: 03/26/2017 14:11   MM DIAG BREAST  TOMO BILATERAL  Result Date: 02/08/2017 CLINICAL DATA:  Right breast upper outer quadrant area of palpable concern. 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 EXAM: 2D DIGITAL DIAGNOSTIC BILATERAL MAMMOGRAM WITH CAD AND ADJUNCT TOMO ULTRASOUND BILATERAL BREAST COMPARISON:  Previous exam(s). ACR Breast Density Category c: The breast tissue is heterogeneously dense, which may obscure small masses. FINDINGS: Mammographically, there is a circumscribed isodense to breast parenchyma mass in the right breast slightly upper outer quadrant, middle depth. Two areas of distortion are seen in the left breast upper outer quadrant and slightly lower inner quadrant, middle depth. Mammographic images were processed with CAD. On physical exam, there is a palpable mass in the right 930 o'clock breast. No palpable abnormality is identified in the left breast. Targeted ultrasound is performed, showing right breast 930 o'clock 4 cm from the nipple solid round lobulated mass which measures 1.3 x 1.1 by 1.2 cm. It is difficult to determine whether or not this finding corresponds to one of the masses identified in 2011. Probable benign cyst is seen in the right breast 10 o'clock 5 cm from the nipple. There is no evidence of right axillary lymphadenopathy. Targeted left breast ultrasound is unrevealing. IMPRESSION: Right breast 930 o'clock solid-appearing mass, for which ultrasound-guided core needle biopsy is recommended. Left breast upper outer quadrant architectural distortion, for which stereotactic core needle biopsy is recommended. Left breast slightly lower inner quadrant more subtle possible architectural distortion. The management of this finding should be based on the pathology results from the left breast upper outer quadrant architectural  distortion biopsy. If the pathology results are benign, then six-month follow-up is recommended  for this second more subtle questionable architectural distortion. If pathology results demonstrate malignancy or high risk lesion, then second stereotactic core needle biopsy should be performed. RECOMMENDATION: Ultrasound-guided core needle biopsy of the right breast. Stereotactic core needle biopsy of the left breast. I have discussed the findings and recommendations with the patient. Results were also provided in writing at the conclusion of the visit. If applicable, a reminder letter will be sent to the patient regarding the next appointment. BI-RADS CATEGORY  4: Suspicious. Electronically Signed   By: Fidela Salisbury M.D.   On: 02/08/2017 17:02   MS DIGITAL SCREENING TOMO BILATERAL  Result Date: 05/22/2020 CLINICAL DATA:  Screening. EXAM: DIGITAL SCREENING BILATERAL MAMMOGRAM WITH TOMO AND CAD COMPARISON:  Previous exam(s). ACR Breast Density Category c: The breast tissue is heterogeneously dense, which may obscure small masses. FINDINGS: There are no findings suspicious for malignancy. Images were processed with CAD. IMPRESSION: No mammographic evidence of malignancy. A result letter of this screening mammogram will be mailed directly to the patient. RECOMMENDATION: Screening mammogram in one year. (Code:SM-B-01Y) BI-RADS CATEGORY  1: Negative. Electronically Signed   By: Ammie Ferrier M.D.   On: 05/22/2020 13:33   MS DIGITAL SCREENING TOMO BILATERAL  Result Date: 04/03/2019 CLINICAL DATA:  Screening. EXAM: DIGITAL SCREENING BILATERAL MAMMOGRAM WITH TOMO AND CAD COMPARISON:  Previous exam(s). ACR Breast Density Category d: The breast tissue is extremely dense, which lowers the sensitivity of mammography FINDINGS: There are no findings suspicious for malignancy. Images were processed with CAD. IMPRESSION: No mammographic evidence of malignancy. A result letter of this screening mammogram will be mailed directly to the patient. RECOMMENDATION: Screening mammogram in one year. (Code:SM-B-01Y)  BI-RADS CATEGORY  1: Negative. Electronically Signed   By: Claudie Revering M.D.   On: 04/03/2019 12:42   MS DIGITAL SCREENING TOMO BILATERAL  Result Date: 02/15/2018 CLINICAL DATA:  Screening. EXAM: DIGITAL SCREENING BILATERAL MAMMOGRAM WITH TOMO AND CAD COMPARISON:  Previous exam(s). ACR Breast Density Category c: The breast tissue is heterogeneously dense, which may obscure small masses. FINDINGS: There are no findings suspicious for malignancy. Images were processed with CAD. IMPRESSION: No mammographic evidence of malignancy. A result letter of this screening mammogram will be mailed directly to the patient. RECOMMENDATION: Screening mammogram in one year. (Code:SM-B-01Y) BI-RADS CATEGORY  1: Negative. Electronically Signed   By: Lajean Manes M.D.   On: 02/15/2018 09:32   MM LT RADIOACTIVE SEED LOC MAMMO GUIDE  Result Date: 03/22/2017 CLINICAL DATA:  45 year old female presenting for radioactive seed localization of 2 sites of distortion in the left breast. EXAM: MAMMOGRAPHIC GUIDED RADIOACTIVE SEED LOCALIZATION OF THE LEFT BREAST COMPARISON:  Previous exam(s). FINDINGS: Patient presents for radioactive seed localization prior to left breast excisional biopsy of 2 sites of distortion. I met with the patient and we discussed the procedure of seed localization including benefits and alternatives. We discussed the high likelihood of a successful procedure. We discussed the risks of the procedure including infection, bleeding, tissue injury and further surgery. We discussed the low dose of radioactivity involved in the procedure. Informed, written consent was given. The usual time-out protocol was performed immediately prior to the procedure. Using mammographic guidance, sterile technique, 1% lidocaine and an I-125 radioactive seed, the coil shaped biopsy marking clip in the upper-outer quadrant of the left breast was localized using a superior approach. The follow-up mammogram images confirm the seed in the  expected location  and were marked for Dr. Marlou Starks. Follow-up survey of the patient confirms presence of the radioactive seed. Order number of I-125 seed:  [id number]. Total activity:  6.389 millicuries  Reference Date: 03/15/2017 Using mammographic guidance, sterile technique, 1% lidocaine and an I-125 radioactive seed, the X shaped biopsy marking clip in the upper inner quadrant of the left breast was localized using a superior approach. I attempted to place the seed a few millimeters inferior to the X shaped biopsy marking clip at the site of distortion, however the final positioning of the seed is just adjacent to the biopsy marking clip. The follow-up mammogram images confirm the seed at the site of the X shaped biopsy marking clip and were marked for Dr. Marlou Starks. Follow-up survey of the patient confirms presence of the radioactive seed. Order number of I-125 seed:  [id number]. Total activity:  1.157 millicuries  Reference Date: 03/15/2017 The patient tolerated the procedure well and was released from the [facility]. She was given instructions regarding seed removal. IMPRESSION: 1. Radioactive seed localization of two sites in the left breast. Note that the area of distortion in the upper inner left breast was noted to be approximately 6 mm inferior to the X shaped biopsy marking clip. The radioactive seed which is at the site of the clip. No apparent complications. Electronically Signed   By: Ammie Ferrier M.D.   On: 03/22/2017 16:08   MM LT RAD SEED EA ADD LESION LOC MAMMO  Result Date: 03/22/2017 CLINICAL DATA:  45 year old female presenting for radioactive seed localization of 2 sites of distortion in the left breast. EXAM: MAMMOGRAPHIC GUIDED RADIOACTIVE SEED LOCALIZATION OF THE LEFT BREAST COMPARISON:  Previous exam(s). FINDINGS: Patient presents for radioactive seed localization prior to left breast excisional biopsy of 2 sites of distortion. I met with the patient and we discussed the procedure of seed  localization including benefits and alternatives. We discussed the high likelihood of a successful procedure. We discussed the risks of the procedure including infection, bleeding, tissue injury and further surgery. We discussed the low dose of radioactivity involved in the procedure. Informed, written consent was given. The usual time-out protocol was performed immediately prior to the procedure. Using mammographic guidance, sterile technique, 1% lidocaine and an I-125 radioactive seed, the coil shaped biopsy marking clip in the upper-outer quadrant of the left breast was localized using a superior approach. The follow-up mammogram images confirm the seed in the expected location and were marked for Dr. Marlou Starks. Follow-up survey of the patient confirms presence of the radioactive seed. Order number of I-125 seed:  [id number]. Total activity:  4.163 millicuries  Reference Date: 03/15/2017 Using mammographic guidance, sterile technique, 1% lidocaine and an I-125 radioactive seed, the X shaped biopsy marking clip in the upper inner quadrant of the left breast was localized using a superior approach. I attempted to place the seed a few millimeters inferior to the X shaped biopsy marking clip at the site of distortion, however the final positioning of the seed is just adjacent to the biopsy marking clip. The follow-up mammogram images confirm the seed at the site of the X shaped biopsy marking clip and were marked for Dr. Marlou Starks. Follow-up survey of the patient confirms presence of the radioactive seed. Order number of I-125 seed:  [id number]. Total activity:  3.212 millicuries  Reference Date: 03/15/2017 The patient tolerated the procedure well and was released from the [facility]. She was given instructions regarding seed removal. IMPRESSION: 1. Radioactive seed localization of two sites in  the left breast. Note that the area of distortion in the upper inner left breast was noted to be approximately 6 mm inferior to the X  shaped biopsy marking clip. The radioactive seed which is at the site of the clip. No apparent complications. Electronically Signed   By: Ammie Ferrier M.D.   On: 03/22/2017 16:08   MM CLIP PLACEMENT LEFT  Result Date: 02/23/2017 CLINICAL DATA:  Status post stereotactic guided biopsy left breast architectural distortion. EXAM: DIAGNOSTIC LEFT MAMMOGRAM POST STEREOTACTIC BIOPSY COMPARISON:  Previous exam(s). FINDINGS: Mammographic images were obtained following stereotactic guided biopsy of left breast distortion. X shaped marking clip located approximately 6 mm superior to the biopsied distortion. IMPRESSION: X shaped marking clip located approximately 6 mm superior to the biopsied distortion. Final Assessment: Post Procedure Mammograms for Marker Placement Electronically Signed   By: Lovey Newcomer M.D.   On: 02/23/2017 10:27   MM CLIP PLACEMENT LEFT  Result Date: 02/15/2017 CLINICAL DATA:  Today, a palpable mass in the 9:30 position of the right breast was biopsied under ultrasound guidance and an area of architectural distortion in the upper-outer quadrant of the left breast was biopsied under stereotactic guidance. EXAM: DIAGNOSTIC BILATERAL MAMMOGRAM POST ULTRASOUND AND STEREOTACTIC BIOPSY COMPARISON:  Previous exam(s). FINDINGS: Mammographic images were obtained following ultrasound guided biopsy of a mass in the 9:30 position of the right breast and following stereotactic biopsy of an area of architectural distortion upper outer quadrant of the left breast. The ribbon shaped biopsy clip appears satisfactorily positioned within the right breast mass. It is noted that the biopsy clip could be seen within the right breast mass at the conclusion of the ultrasound-guided biopsy as well. A coil shaped biopsy clip is satisfactorily positioned within the area of architectural distortion in the upper-outer quadrant of the left breast. IMPRESSION: 1. Satisfactory position of ribbon shaped biopsy clip in the  right breast. 2. Satisfactory position of coil shaped biopsy clip in the left breast. Final Assessment: Post Procedure Mammograms for Marker Placement Electronically Signed   By: Curlene Dolphin M.D.   On: 02/15/2017 09:00   MM CLIP PLACEMENT RIGHT  Result Date: 02/16/2017 CLINICAL DATA:  Today, a palpable mass in the 9:30 position of the right breast was biopsied under ultrasound guidance and an area of architectural distortion in the upper-outer quadrant of the left breast was biopsied under stereotactic guidance. EXAM: DIAGNOSTIC BILATERAL MAMMOGRAM POST ULTRASOUND AND STEREOTACTIC BIOPSY COMPARISON:  Previous exam(s). FINDINGS: Mammographic images were obtained following ultrasound guided biopsy of a mass in the 9:30 position of the right breast and following stereotactic biopsy of an area of architectural distortion upper outer quadrant of the left breast. The ribbon shaped biopsy clip appears satisfactorily positioned within the right breast mass. It is noted that the biopsy clip could be seen within the right breast mass at the conclusion of the ultrasound-guided biopsy as well. A coil shaped biopsy clip is satisfactorily positioned within the area of architectural distortion in the upper-outer quadrant of the left breast. IMPRESSION: 1. Satisfactory position of ribbon shaped biopsy clip in the right breast. 2. Satisfactory position of coil shaped biopsy clip in the left breast. Final Assessment: Post Procedure Mammograms for Marker Placement Electronically Signed   By: Curlene Dolphin M.D.   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 This was found to be concordant by Dr. Lovey Newcomer, with excision recommended. Pathology results were discussed with the patient by telephone by Kathrine Haddock, Bilingual Patient  Services Representative. The patient reported doing well after the biopsy with tenderness at the site. Post biopsy instructions and care were reviewed and questions were answered. The patient was encouraged to call [facility] for any additional concerns. Surgical consultation has been arranged with Dr. Autumn Messing at [facility] on Mar 13, 2017. The patient has a recent diagnosis of complex sclerosing lesion of the upper outer quadrant of the Left breast, which is also recommended for excision. Pathology results reported by Terie Purser, RN on 02/26/2017. Electronically Signed   By: Lovey Newcomer M.D.   On: 02/27/2017 12:49   Result Date: 02/27/2017 CLINICAL DATA:  Patient with second area of distortion within the lower inner left breast. EXAM: LEFT BREAST STEREOTACTIC CORE NEEDLE BIOPSY COMPARISON:  Previous exams. FINDINGS: The patient and I discussed the procedure of stereotactic-guided biopsy including benefits and alternatives. We discussed the high likelihood of a successful procedure. We discussed the risks of the procedure including infection, bleeding, tissue injury, clip migration, and inadequate sampling. Informed written consent was given. The usual time out protocol was performed immediately prior to the procedure. Using sterile technique and 1% Lidocaine as local anesthetic, under stereotactic guidance, a 9 gauge vacuum assisted device was used to perform core needle biopsy of distortion within the lower inner left breast using a cranial approach. Lesion quadrant: Lower inner quadrant At the conclusion of the procedure, a X shaped tissue marker clip was deployed into the biopsy cavity. Follow-up 2-view mammogram was performed and dictated separately. IMPRESSION: Stereotactic-guided biopsy of left breast distortion. No apparent complications. Electronically Signed: By: Lovey Newcomer M.D. 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 This was found to be concordant byDr. Curlene Dolphin. Pathology revealed COMPLEX SCLEROSING LESION of the Left breast. This was found to be concordant by Dr. Curlene Dolphin, with excision recommended. Pathology results were discussed with the patient by telephone by Kathrine Haddock, Bilingual Patient Services Representative. The patient reported doing well after the biopsies with tenderness and bruising at the sites. Post biopsy instructions and care were reviewed and questions were answered. The patient was encouraged to call [facility] for any additional concerns. Surgical consultation has been arranged with Dr. Autumn Messing at [facility] on Mar 13, 2017. The patient is scheduled for a left breast stereotatic biopsy at [facility] on Feb 23, 2017. Pathology results reported by Terie Purser, RN on 02/20/2017. Electronically Signed   By: Curlene Dolphin M.D.   On: 02/20/2017 14:36   Result Date: 02/20/2017 CLINICAL DATA:  Stereotactic biopsy was recommended of the area of architectural distortion in the upper-outer quadrant of the left breast. EXAM: LEFT BREAST STEREOTACTIC CORE NEEDLE BIOPSY COMPARISON:  Previous exams. FINDINGS: The patient and I discussed the procedure of stereotactic-guided biopsy including benefits and alternatives. We discussed the high likelihood of a successful procedure. We discussed the risks of the procedure including infection, bleeding, tissue injury, clip migration, and inadequate sampling. Informed written consent was given. The usual time out protocol was performed immediately prior to the procedure. Using sterile technique and  1% Lidocaine as local anesthetic, under stereotactic guidance, a 9 gauge vacuum assisted device was used to perform core needle biopsy of architectural  distortion in the upper-outer quadrant of the left breast using a superior to inferior approach. Specimen radiograph was performed showing tissue. The target lesion does not contain calcifications. Lesion quadrant: Upper-outer quadrant At the conclusion of the procedure, a coil shaped tissue marker clip was deployed into the biopsy cavity. Follow-up 2-view mammogram was performed and dictated separately. IMPRESSION: Stereotactic-guided biopsy of the left breast. No apparent complications. Electronically Signed: By: Curlene Dolphin M.D. On: 02/15/2017 09:01      Pelvic/Bimanual Pap is not indicated today per BCCCP guidelines.   Smoking History: Patient has never smoked.  Patient Navigation: Patient education provided. Access to services provided for patient through [facility] program. Spanish interpreter [name] from [facility] provided.   Colorectal Cancer Screening: Per patient has never had colonoscopy completed. FIT Test given to patient to complete. No complaints today.    Breast and Cervical Cancer Risk Assessment: Patient does not have family history of breast cancer, known genetic mutations, or radiation treatment to the chest before age 58. Patient does not have history of cervical dysplasia, immunocompromised, or DES exposure in-utero.  Risk Assessment     Risk Scores       08/16/2021 05/20/2020   Last edited by: Demetrius Revel, LPN [place], Sherie Mamie Nick, LPN   5-year risk: 1.7 % 1.4 %   Lifetime risk: 11.8 % 12.1 %            A: BCCCP exam without pap smear No complaints.  P: Referred patient to the [facility] for a screening mammogram on mobile unit. Appointment scheduled Tuesday, August 16, 2021 at 1050.  Loletta Parish, RN 08/16/2021 10:32 AM

## 2024-01-01 ENCOUNTER — Telehealth: Payer: Self-pay

## 2024-01-01 NOTE — Telephone Encounter (Signed)
 Telephoned patient at mobile number, using interpreter Orie Fisherman. No answer, voice mail full. BCCCP

## 2024-01-31 NOTE — Telephone Encounter (Signed)
 Telephoned patient using interpreter#[id number]. No answer, no voice mail. BCCCP

## 2024-02-22 ENCOUNTER — Other Ambulatory Visit: Payer: Self-pay | Admitting: Obstetrics and Gynecology

## 2024-02-22 DIAGNOSIS — Z1231 Encounter for screening mammogram for malignant neoplasm of breast: Secondary | ICD-10-CM

## 2024-05-06 ENCOUNTER — Ambulatory Visit

## 2024-06-05 ENCOUNTER — Ambulatory Visit: Payer: Self-pay | Admitting: *Deleted

## 2024-06-05 ENCOUNTER — Ambulatory Visit
Admission: RE | Admit: 2024-06-05 | Discharge: 2024-06-05 | Disposition: A | Discharge status: Home / Self Care | Source: Ambulatory Visit | Attending: Obstetrics and Gynecology | Admitting: Obstetrics and Gynecology

## 2024-06-05 VITALS — BP 136/88 | Ht 62.25 in | Wt 121.0 lb

## 2024-06-05 DIAGNOSIS — Z1239 Encounter for other screening for malignant neoplasm of breast: Secondary | ICD-10-CM

## 2024-06-05 DIAGNOSIS — Z1211 Encounter for screening for malignant neoplasm of colon: Secondary | ICD-10-CM

## 2024-06-05 DIAGNOSIS — Z1231 Encounter for screening mammogram for malignant neoplasm of breast: Secondary | ICD-10-CM

## 2024-06-05 NOTE — Patient Instructions (Signed)
 Explained breast self awareness with Carla Fletcher. Patient did not need a Pap smear today due to last Pap smear and HPV Typing was 05/20/2020. Let her know BCCCP will cover Pap smears and HPV typing every 5 years unless has a history of abnormal Pap smears. Referred [facility] to the [facility] for a screening mammogram on mobile unit. Appointment scheduled Thursday, June 05, 2024 at 1030. Patient aware of appointment and will be there. Let patient know the Breast Center  will follow up with her within the next couple weeks with results of her mammogram by letter or phone. Carla Fletcher verbalized understanding.  Kadesha Virrueta, Wanda Ship, RN 10:03 AM

## 2024-06-05 NOTE — Progress Notes (Signed)
 Carla Fletcher is a 48 y.o. female who presents to [facility] clinic today with complaint of nipple dryness.    Pap Smear: Pap smear not completed today. Last Pap smear was 05/20/2020 at [facility] clinic and was normal with negative HPV. Per patient has no history of an abnormal Pap smear. Last Pap smear result is available in Epic.   Physical exam: Breasts Right breast greater than left breast due to history of left breast lumpectomy. Bilateral nipple dryness observed. 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 No nipple retraction bilateral breasts. No nipple discharge bilateral breasts. No lymphadenopathy.  No lumps palpated bilateral breasts. No complaints of pain or tenderness on exam.  MM 3D SCREEN BREAST BILATERAL Result Date: 08/17/2021 CLINICAL DATA:  Screening. EXAM: DIGITAL SCREENING BILATERAL MAMMOGRAM WITH TOMOSYNTHESIS AND CAD TECHNIQUE: Bilateral screening digital craniocaudal and mediolateral oblique mammograms were obtained. Bilateral screening digital breast tomosynthesis was performed. The images were evaluated with computer-aided detection. COMPARISON:  Previous exam(s). ACR Breast Density Category c: The breast tissue is heterogeneously dense, which may obscure small masses. FINDINGS: There are no findings suspicious for malignancy. IMPRESSION: No mammographic evidence of malignancy. A result letter of this screening mammogram will be mailed directly to the patient. RECOMMENDATION: Screening mammogram in one year. (Code:SM-B-01Y) BI-RADS CATEGORY  1: Negative. Electronically Signed   By: Almarie Daring M.D.   On: 08/17/2021 12:10   MS DIGITAL SCREENING TOMO BILATERAL Result Date: 05/22/2020 CLINICAL DATA:  Screening. EXAM: DIGITAL SCREENING BILATERAL MAMMOGRAM WITH TOMO AND CAD COMPARISON:  Previous exam(s). ACR Breast Density Category c: The breast tissue is heterogeneously dense, which may obscure small  masses. FINDINGS: There are no findings suspicious for malignancy. Images were processed with CAD. IMPRESSION: No mammographic evidence of malignancy. A result letter of this screening mammogram will be mailed directly to the patient. RECOMMENDATION: Screening mammogram in one year. (Code:SM-B-01Y) BI-RADS CATEGORY  1: Negative. Electronically Signed   By: Rosaline Collet M.D.   On: 05/22/2020 13:33      Pelvic/Bimanual Pap is not indicated today per BCCCP guidelines.   Smoking History: Patient has never smoked.   Patient Navigation: Patient education provided. Access to services provided for patient through [place] program. Spanish interpreter Bernice Angry from [facility] provided.   Colorectal Cancer Screening: Per patient has never had colonoscopy completed. FIT test given to patient to complete. No complaints today.    Breast and Cervical Cancer Risk Assessment: Patient does not have family history of breast cancer, known genetic mutations, or radiation treatment to the chest before age 66. Patient does not have history of cervical dysplasia, immunocompromised, or DES exposure in-utero.  Risk Scores as of Encounter on 06/05/2024     Carla Fletcher           5-year 2.3%   Lifetime 17.03%   This patient is [place] but has no documented birth country, so the [place] model used data from [place] patients to calculate their risk score. Document a birth country in the Demographics activity for a more accurate score.         Last calculated by Silas, Ansyi K, CMA on 06/05/2024 at  9:57 AM        A: BCCCP exam without pap smear Complaint of bilateral nipple dryness.  P: Referred patient to the [facility] for a screening mammogram on mobile unit. Appointment scheduled Thursday, June 05, 2024 at 1030.  Driscilla Wanda SQUIBB, RN 06/05/2024 10:03 AM
